# Patient Record
Sex: Female | Born: 1975 | Race: Black or African American | Hispanic: No | Smoking: Never smoker
Health system: Southern US, Community
[De-identification: ages and names within clinical notes are randomized; demographics above are authoritative.]

## PROBLEM LIST (undated history)

## (undated) ENCOUNTER — Inpatient Hospital Stay (HOSPITAL_COMMUNITY): Payer: Managed Care, Other (non HMO)

## (undated) DIAGNOSIS — G43909 Migraine, unspecified, not intractable, without status migrainosus: Secondary | ICD-10-CM

## (undated) DIAGNOSIS — R238 Other skin changes: Secondary | ICD-10-CM

## (undated) DIAGNOSIS — F419 Anxiety disorder, unspecified: Secondary | ICD-10-CM

## (undated) DIAGNOSIS — I10 Essential (primary) hypertension: Secondary | ICD-10-CM

## (undated) DIAGNOSIS — K219 Gastro-esophageal reflux disease without esophagitis: Secondary | ICD-10-CM

## (undated) DIAGNOSIS — R569 Unspecified convulsions: Secondary | ICD-10-CM

## (undated) DIAGNOSIS — D649 Anemia, unspecified: Secondary | ICD-10-CM

## (undated) DIAGNOSIS — J45909 Unspecified asthma, uncomplicated: Secondary | ICD-10-CM

## (undated) DIAGNOSIS — F32A Depression, unspecified: Secondary | ICD-10-CM

## (undated) DIAGNOSIS — F329 Major depressive disorder, single episode, unspecified: Secondary | ICD-10-CM

## (undated) DIAGNOSIS — E7800 Pure hypercholesterolemia, unspecified: Secondary | ICD-10-CM

## (undated) DIAGNOSIS — R011 Cardiac murmur, unspecified: Secondary | ICD-10-CM

## (undated) DIAGNOSIS — E669 Obesity, unspecified: Secondary | ICD-10-CM

## (undated) DIAGNOSIS — T7840XA Allergy, unspecified, initial encounter: Secondary | ICD-10-CM

## (undated) DIAGNOSIS — E781 Pure hyperglyceridemia: Secondary | ICD-10-CM

## (undated) DIAGNOSIS — G4730 Sleep apnea, unspecified: Secondary | ICD-10-CM

## (undated) DIAGNOSIS — E785 Hyperlipidemia, unspecified: Secondary | ICD-10-CM

## (undated) HISTORY — DX: Unspecified asthma, uncomplicated: J45.909

## (undated) HISTORY — PX: APPENDECTOMY: SHX54

## (undated) HISTORY — DX: Hyperlipidemia, unspecified: E78.5

## (undated) HISTORY — PX: TUBAL LIGATION: SHX77

## (undated) HISTORY — DX: Cardiac murmur, unspecified: R01.1

## (undated) HISTORY — DX: Migraine, unspecified, not intractable, without status migrainosus: G43.909

## (undated) HISTORY — DX: Allergy, unspecified, initial encounter: T78.40XA

## (undated) HISTORY — DX: Sleep apnea, unspecified: G47.30

## (undated) HISTORY — DX: Essential (primary) hypertension: I10

## (undated) HISTORY — PX: TONSILLECTOMY: SUR1361

## (undated) HISTORY — DX: Other skin changes: R23.8

## (undated) HISTORY — DX: Gastro-esophageal reflux disease without esophagitis: K21.9

## (undated) HISTORY — DX: Obesity, unspecified: E66.9

## (undated) HISTORY — DX: Anemia, unspecified: D64.9

---

## 2000-10-07 ENCOUNTER — Other Ambulatory Visit: Admission: RE | Admit: 2000-10-07 | Discharge: 2000-10-07 | Payer: Self-pay | Admitting: *Deleted

## 2003-02-11 ENCOUNTER — Emergency Department (HOSPITAL_COMMUNITY): Admission: EM | Admit: 2003-02-11 | Discharge: 2003-02-11 | Payer: Self-pay | Admitting: Emergency Medicine

## 2003-09-30 ENCOUNTER — Emergency Department (HOSPITAL_COMMUNITY): Admission: EM | Admit: 2003-09-30 | Discharge: 2003-09-30 | Payer: Self-pay | Admitting: Emergency Medicine

## 2003-11-01 ENCOUNTER — Ambulatory Visit (HOSPITAL_COMMUNITY): Admission: RE | Admit: 2003-11-01 | Discharge: 2003-11-01 | Payer: Self-pay | Admitting: Family Medicine

## 2006-11-01 ENCOUNTER — Emergency Department (HOSPITAL_COMMUNITY): Admission: EM | Admit: 2006-11-01 | Discharge: 2006-11-02 | Payer: Self-pay | Admitting: Emergency Medicine

## 2006-11-02 ENCOUNTER — Ambulatory Visit (HOSPITAL_COMMUNITY): Admission: RE | Admit: 2006-11-02 | Discharge: 2006-11-02 | Payer: Self-pay | Admitting: Obstetrics and Gynecology

## 2006-11-05 ENCOUNTER — Inpatient Hospital Stay (HOSPITAL_COMMUNITY): Admission: AD | Admit: 2006-11-05 | Discharge: 2006-11-05 | Payer: Self-pay | Admitting: Obstetrics and Gynecology

## 2007-08-12 ENCOUNTER — Inpatient Hospital Stay (HOSPITAL_COMMUNITY): Admission: AD | Admit: 2007-08-12 | Discharge: 2007-08-13 | Payer: Self-pay | Admitting: Obstetrics and Gynecology

## 2007-08-17 ENCOUNTER — Inpatient Hospital Stay (HOSPITAL_COMMUNITY): Admission: RE | Admit: 2007-08-17 | Discharge: 2007-08-20 | Payer: Self-pay | Admitting: Obstetrics and Gynecology

## 2007-08-17 ENCOUNTER — Encounter (INDEPENDENT_AMBULATORY_CARE_PROVIDER_SITE_OTHER): Payer: Self-pay | Admitting: Obstetrics and Gynecology

## 2010-03-28 ENCOUNTER — Encounter: Payer: Self-pay | Admitting: Cardiology

## 2010-04-05 ENCOUNTER — Encounter: Payer: Self-pay | Admitting: Cardiology

## 2010-04-05 ENCOUNTER — Ambulatory Visit (INDEPENDENT_AMBULATORY_CARE_PROVIDER_SITE_OTHER): Payer: Managed Care, Other (non HMO) | Admitting: Cardiology

## 2010-04-05 DIAGNOSIS — E669 Obesity, unspecified: Secondary | ICD-10-CM

## 2010-04-05 DIAGNOSIS — E663 Overweight: Secondary | ICD-10-CM | POA: Insufficient documentation

## 2010-04-05 DIAGNOSIS — E785 Hyperlipidemia, unspecified: Secondary | ICD-10-CM

## 2010-04-09 ENCOUNTER — Other Ambulatory Visit: Payer: Self-pay | Admitting: Cardiology

## 2010-04-09 ENCOUNTER — Encounter: Payer: Self-pay | Admitting: Cardiology

## 2010-04-09 ENCOUNTER — Other Ambulatory Visit (INDEPENDENT_AMBULATORY_CARE_PROVIDER_SITE_OTHER): Payer: Managed Care, Other (non HMO)

## 2010-04-09 DIAGNOSIS — E785 Hyperlipidemia, unspecified: Secondary | ICD-10-CM

## 2010-04-10 LAB — LIPID PANEL
Total CHOL/HDL Ratio: 5
VLDL: 33.2 mg/dL (ref 0.0–40.0)

## 2010-04-10 NOTE — Assessment & Plan Note (Signed)
Summary: np6. [facility] office asking for tw.  per karen office [phone]...   Visit Type:  Initial Consult Primary Provider:  Dr. Vincente Poli or Dr, Phillips Odor  CC:  anxiety. pt has no complaints today.  History of Present Illness: Samantha Monroe comes today refered for elevated trigycerides of 422. This was a nonfasting sample. TC was above 200.  She has no other CRFs except sedentary lifestyle. She denies any chest pain or exertional limitations. She struggles with her weight.  Current Medications (verified): 1)  Hydrocodone-Acetaminophen 5-325 Mg Tabs (Hydrocodone-Acetaminophen) .Marland Kitchen.. 1 To 2 Tablets Every 4-6 Hours As Needed For Headaches 2)  Lexapro 20 Mg Tabs (Escitalopram Oxalate) .... Take 1 Tablet By Mouth Once A Day 3)  Clonazepam 1 Mg Tabs (Clonazepam) .Marland Kitchen.. 1 Tab Two Times A Day As Needed For Anxiety 4)  Vitamin D (Ergocalciferol) 50000 Unit Caps (Ergocalciferol) .Marland Kitchen.. 1 Weekly 5)  Zolpidem Tartrate 10 Mg Tabs (Zolpidem Tartrate) .Marland Kitchen.. 1 Tab At Bedtime For Sleep 6)  Proair Hfa 108 (90 Base) Mcg/act Aers (Albuterol Sulfate) .... As Directed  Allergies (verified): 1)  ! Asa 2)  ! * Shellfish  Past History:  Past Surgical History: Last updated: 04/04/2010 TUBALIGATION  Social History: Last updated: 04/04/2010 Married  Tobacco Use - No.  Alcohol Use - no  Risk Factors: Smoking Status: never (04/04/2010)  Review of Systems       negative other than HPI  Vital Signs:  Patient profile:   35 year old female Height:      62 inches Weight:      148.50 pounds BMI:     27.26 Pulse rate:   96 / minute Resp:     18 per minute BP sitting:   126 / 100  (left arm) Cuff size:   large  Vitals Entered By: Celestia Khat, CMA (April 05, 2010 4:09 PM)  Physical Exam  General:  obese.   Head:  normocephalic and atraumatic Eyes:  PERRLA/EOM intact; conjunctiva and lids normal. Neck:  Neck supple, no JVD. No masses, thyromegaly or abnormal cervical nodes. Chest Samantha Monroe:  no deformities  or breast masses noted Lungs:  Clear bilaterally to auscultation and percussion. Heart:  PMI NORMAL, N1 S1 AND S2, NO MURMUR, NO BRUITS Abdomen:  BS NORMAL, NT, ND, NO BRUITS. Msk:  Back normal, normal gait. Muscle strength and tone normal. Pulses:  pulses normal in all 4 extremities Extremities:  No clubbing or cyanosis. Neurologic:  Alert and oriented x 3. Skin:  Intact without lesions or rashes. Psych:  Normal affect.   Problems:  Medical Problems Added: 1)  Dx of Overweight/obesity  (ICD-278.02) 2)  Dx of Dyslipidemia  (ICD-272.4)  Impression & Recommendations:  Problem # 1:  DYSLIPIDEMIA (ICD-272.4) Assessment New NEEDS FASTING LIPIDS ESP TRIGLYCERIDES. NEEDS 3 HRS EXERCISE A WEEK AND LOSE 15 LBS REALISTICALLY. Orders: EKG w/ Interpretation (93000)  Problem # 2:  OVERWEIGHT/OBESITY (ICD-278.02)  Patient Instructions: 1)  Your physician recommends that you schedule a follow-up appointment in: as needed with Dr. Daleen Squibb 2)  Your physician recommends that you continue on your current medications as directed. Please refer to the Current Medication list given to you today. 3)  Your physician recommends that you return for a FASTING lipid profile: Make sure you have been fasting for 12 hours before your lab test 4)  Follow a low carbohydrate diet. Please see diet information. 5)  Your physician discussed the importance of regular exercise and recommended that you start or continue a regular exercise  program for good health. Walk 30 minutes a day for a total of 3 hours / week

## 2010-04-30 NOTE — Letter (Signed)
[facility]   Imported By: Kassie Mends 04/24/2010 08:42:46  _____________________________________________________________________  External Attachment:    Type:   Image     Comment:   External Document

## 2010-05-29 ENCOUNTER — Emergency Department (HOSPITAL_COMMUNITY)
Admission: EM | Admit: 2010-05-29 | Discharge: 2010-05-30 | Disposition: A | Discharge status: Home / Self Care | Payer: Managed Care, Other (non HMO) | Attending: Emergency Medicine | Admitting: Emergency Medicine

## 2010-05-29 DIAGNOSIS — Z79899 Other long term (current) drug therapy: Secondary | ICD-10-CM | POA: Insufficient documentation

## 2010-05-29 DIAGNOSIS — F341 Dysthymic disorder: Secondary | ICD-10-CM | POA: Insufficient documentation

## 2010-05-29 DIAGNOSIS — J351 Hypertrophy of tonsils: Secondary | ICD-10-CM | POA: Insufficient documentation

## 2010-05-29 DIAGNOSIS — J029 Acute pharyngitis, unspecified: Secondary | ICD-10-CM | POA: Insufficient documentation

## 2010-05-29 LAB — RAPID STREP SCREEN (MED CTR MEBANE ONLY): Streptococcus, Group A Screen (Direct): NEGATIVE

## 2010-07-02 NOTE — Op Note (Signed)
NAME:  Samantha Monroe, Samantha Monroe               ACCOUNT NO.:  [id number]   MEDICAL RECORD NO.:  [id number]          PATIENT TYPE:  INP   LOCATION:  9118                          FACILITY:  [facility]   PHYSICIAN:  Guy Sandifer. Henderson Cloud, M.D. DATE OF BIRTH:  [birth year]   DATE OF PROCEDURE:  08/17/2007  DATE OF DISCHARGE:                               OPERATIVE REPORT   PREOPERATIVE DIAGNOSES:  1. Intrauterine pregnancy at 36-3/7 weeks estimated gestational age.  2. Marginal placental abruption.   POSTOPERATIVE DIAGNOSES:  1. Intrauterine pregnancy at 36-3/7 weeks estimated gestational age.  2. Marginal placental abruption.   PROCEDURE:  Repeat low transverse cesarean section and bilateral tubal  ligation.   SURGEON:  Guy Sandifer. Henderson Cloud, MD   ANESTHESIA:  Spinal, Raul Del, MD   SPECIMENS:  Placenta and bilateral fallopian tube segments to pathology.   FINDINGS:  Viable female infant, Apgars of 9 and 9 at 1 and 5 minutes  respectively.  Birth weight pending.  Arterial cord pH 7.36.   ESTIMATED BLOOD LOSS:  900 mL.   INDICATIONS AND CONSENT:  This patient is a 35 year old married black  female G5, P2 with an EDC of September 12, 2007.  Prenatal care has been  complicated by 2 previous cesarean sections.  She has a history of  anxiety and depression and was on an SSRI antidepressant, had  conception.  Currently, not on medication for that.  She has had  complaints of bleeding, on and off for several days.  She was treated  recently for urinary tract infection.  She passed a clot vaginally in  the last 2 days or so.  Denies trauma or abdominal pain.  Today, in the  office  ultrasound is consistent with a 4 cm clot at the placental  margin.  There is an anterior placenta and an estimated fetal weight of  7 pounds 4 ounces.  Uterus is soft and nontender.  Fetal heart tones  were reactive.  Recommendation for delivery is made secondary to  possibility of progressing placental abruption.  This is discussed  with  the patient.  Potential risks and complications were reviewed  preoperatively including but limited to infection, organ damage,  bleeding requiring transfusion of blood products with possible  transfusion reaction, HIV and hepatitis acquisition, DVT, PE, and  pneumonia.  Possible placenta accreta in view of the anterior placenta  in the 2 previous cesarean section was discussed.  Possible percreta and  possible cesarean hysterectomy and/or damage and repair to the bladder,  is also reviewed.  The patient is typed and crossed for 2 units  preoperatively.  In the office, the cervix was closed and thick, and  there is no active bleeding at present.   PROCEDURE:  The patient is taken to operating room where she is  identified, spinal anesthetic is placed, and she is placed in the dorsal  supine position with a 15 degrees left lateral wedge.  She is prepped,  Foley catheter was placed in the bladder to drain and she is draped in  the sterile fashion.  After testing for adequate spinal anesthesia,  skin  is entered through the previous Pfannenstiel scar.  Dissection is  carried out in layers to peritoneum.  Peritoneum is incised extended  superiorly and inferiorly.  Vesicouterine peritoneum is taken down  cephalad laterally.  Bladder flap was developed and the bladder blade  was placed.  Uterus is incised in a low transverse manner.  The uterine  cavity is entered bluntly with a hemostat.  The uterine incision is  extended cephalad laterally with fingers.  Artificial rupture of  membranes for clear fluid is carried out.  Vertex was then delivered  without difficulty.  Remainder of the baby is delivered.  There is a  true knot noted in the cord.  The oronasal pharynx were suctioned.  Good  cry and tone is noted and the cord is clamped and cut.  The baby is  handed to the waiting pediatrics team.  Placenta was manually delivered  and sent to pathology.  Uterine cavity is clean.  Uterus is  closed in  two running locking imbricating layers of 0 Monocryl suture which  achieves good hemostasis.  Tubes and ovaries are normal bilaterally.  After again confirming with the patient that she wants a sterilization,  the left fallopian tube was identified from cornu to fimbria.  It is  grasped in its mid ampullary portion with a Babcock clamp.  A knuckle of  tube is then doubly ligated with two free ties of zero plain suture.  The intervening knuckle was then sharply resected with scissors.  Cautery is used to obtain complete hemostasis.  Similar procedure is  carried out on the right side.  Irrigation is carried out.  Anterior  peritoneum was closed in running fashion with 0 Monocryl suture and the  anterior rectus fascia is closed with a 0 PDS suture.  Skin is closed  with clips.  All sponge, instruments and needle counts were correct and  the patient is transferred to recovery room in stable condition.      Guy Sandifer Henderson Cloud, M.D.  Electronically Signed     JET/MEDQ  D:  08/17/2007  T:  08/18/2007  Job:  [id number]

## 2010-07-05 NOTE — Discharge Summary (Signed)
NAME:  Samantha Monroe, Samantha Monroe               ACCOUNT NO.:  [id number]   MEDICAL RECORD NO.:  [id number]          PATIENT TYPE:  INP   LOCATION:  9118                          [facility]ILITY:  [facility]   PHYSICIAN:  Michelle L. Grewal, M.D.DATE OF BIRTH:  Jul 04, 1975   DATE OF ADMISSION:  08/17/2007  DATE OF DISCHARGE:  08/20/2007                               DISCHARGE SUMMARY   ADMITTING DIAGNOSES:  1. Intrauterine pregnancy at 36-3/7th weeks' estimated gestational      age.  2. Marginal placental abruption.   DISCHARGE DIAGNOSES:  1. Status post low-transverse cesarean section.  2. Viable female infant.  3. Permanent sterilization.   PROCEDURE:  1. Repeat low-transverse cesarean section.  2. Bilateral tubal ligation.   REASON FOR ADMISSION:  Please see written H&P.   HOSPITAL COURSE:  The patient is a 35 year old married black female  gravida 5, para 2 that presented to [facility] for a  cesarean delivery.  The patient had been complaining of some bleeding  off and on for several days.  She had recently been treated for urinary  tract infection.  The patient did pass a clot vaginally in the last 2  days.  She denied any trauma or abdominal pain.  The patient had been  seen in the office on the day of admission where an ultrasound had been  performed, which revealed a 4-cm clot noted at the placental margin.  Placenta was noted to be anterior and estimated fetal weight was 7  pounds 4 ounces.  Uterus was soft and nontender and fetal heart tones  were reactive.  Decision was made to proceed with a cesarean delivery  secondary to the possibility for progressing placental abruption.  Due  to multiparity, the patient requested permanent sterilization.  Cervix  was examined in the office and was found to be closed and thick and no  active bleeding was noted at the time of office visit.  The patient was  then taken to the operating room where spinal anesthesia was  administered without  difficulty.  A low-transverse incision was made  with delivery of a viable female infant weighing 6 pounds 10 ounces with  Apgars of 9 at and 9 at 5 minutes.  Arterial cord pH of 7.36.  Bilateral tubal ligation was performed without difficulty.  The patient  tolerated the procedure well and was taken to the recovery room in  stable condition.  On postoperative day 1, the patient was without  complaint.  She denied headache or blurred vision or right upper  quadrant pain.  Vital signs were stable.  Blood pressures 116/81 to  131/86.  Deep tendon reflexes were 1+ and no clonus.  Abdomen soft.  Fundus firm and nontender.  Abdominal dressing noted to be clean, dry,  and intact.  Due to the patient's slightly elevation in blood pressure  with a history of placental abruption, PIH labs were ordered for the  following morning.  On postoperative day 2, the patient did desire early  discharge.  Vital signs were stable.  Blood pressures were 116/76 to  108/73.  Abdomen soft.  Fundus firm and nontender.  Incision was clean,  dry, and intact.  Laboratory findings with hemoglobin of 11.4, platelet  count of 223,000, WBC count of 11.2, AST was 26, ALT was 14, and uric  acid was 4.5.  Later that day, the patient decided to stay an additional  day.  Discharge was cancelled.  On postoperative day 3, vital signs were  stable.  She was afebrile.  Fundus firm and nontender.  Incision was  clean, dry, and intact.  Staples removed and the patient was later  discharged home.   CONDITION ON DISCHARGE:  Stable.   DIET:  Regular as tolerated.   ACTIVITY:  No heavy lifting, no driving x2 weeks, and no vaginal entry.   FOLLOW UP:  The patient is to follow up in the office in 1-2 weeks for  incision check.  She is to call for temperature greater than 100  degrees, persistent nausea, vomiting, heavy vaginal bleeding, and/or  redness or drainage from the incisional site.   DISCHARGE MEDICATIONS:  1.  Tylox #30 one p.o. every 4-6 hours p.r.n.  2. Motrin 600 mg every 6 hours.  3. Prenatal vitamins 1 p.o. daily.      Julio Sicks, N.P.      Stann Mainland. Vincente Poli, M.D.  Electronically Signed    CC/MEDQ  D:  09/22/2007  T:  09/23/2007  Job:  [id number]

## 2010-11-14 LAB — CROSSMATCH: ABO/RH(D): A NEG

## 2010-11-14 LAB — CBC
HCT: 36.6
HCT: 36.7
Hemoglobin: 12.5
Hemoglobin: 12.4
MCHC: 34.1
MCV: 87.3
Platelets: 248
Platelets: 223
Platelets: 228
RBC: 4.24
RDW: 15.3
RDW: 15.2
WBC: 7.7
WBC: 11.2 — ABNORMAL HIGH

## 2010-11-14 LAB — URINALYSIS, ROUTINE W REFLEX MICROSCOPIC
Bilirubin Urine: NEGATIVE
Glucose, UA: NEGATIVE
Ketones, ur: NEGATIVE
Protein, ur: NEGATIVE
Urobilinogen, UA: 0.2
pH: 6.5

## 2010-11-14 LAB — URIC ACID: Uric Acid, Serum: 4.5

## 2010-11-14 LAB — RH IMMUNE GLOB WKUP(>/=20WKS)(NOT WOMEN'S HOSP)

## 2010-11-14 LAB — CCBB MATERNAL DONOR DRAW

## 2010-11-14 LAB — RPR: RPR Ser Ql: NONREACTIVE

## 2010-11-14 LAB — COMPREHENSIVE METABOLIC PANEL
AST: 26
Albumin: 2.3 — ABNORMAL LOW
Alkaline Phosphatase: 78
BUN: 2 — ABNORMAL LOW
Calcium: 8.8
GFR calc non Af Amer: >60
Potassium: 3.5
Sodium: 137

## 2010-11-14 LAB — URINE MICROSCOPIC-ADD ON

## 2010-11-14 LAB — DIFFERENTIAL
Eosinophils Relative: 2
Lymphocytes Relative: 15
Monocytes Relative: 8
Neutro Abs: 8.5 — ABNORMAL HIGH

## 2010-11-14 LAB — LACTATE DEHYDROGENASE: LDH: 155

## 2010-11-28 LAB — RH IMMUNE GLOBULIN WORKUP (NOT WOMEN'S HOSP)
ABO/RH(D): A NEG
Antibody Screen: NEGATIVE

## 2010-11-29 LAB — URINALYSIS, ROUTINE W REFLEX MICROSCOPIC
Bilirubin Urine: NEGATIVE
Glucose, UA: NEGATIVE
Ketones, ur: NEGATIVE
Ketones, ur: NEGATIVE
Leukocytes, UA: NEGATIVE
Nitrite: NEGATIVE
Protein, ur: NEGATIVE
Specific Gravity, Urine: 1.02
Urobilinogen, UA: 0.2
Urobilinogen, UA: 0.2

## 2010-11-29 LAB — GC/CHLAMYDIA PROBE AMP, GENITAL: Chlamydia, DNA Probe: NEGATIVE

## 2010-11-29 LAB — ABO/RH: ABO/RH(D): A NEG

## 2010-11-29 LAB — WET PREP, GENITAL

## 2010-11-29 LAB — RPR: RPR Ser Ql: NONREACTIVE

## 2010-11-29 LAB — HCG, QUANTITATIVE, PREGNANCY: hCG, Beta Chain, Quant, S: 5656 — ABNORMAL HIGH

## 2011-04-08 ENCOUNTER — Encounter (HOSPITAL_COMMUNITY): Payer: Self-pay

## 2011-04-08 ENCOUNTER — Emergency Department (HOSPITAL_COMMUNITY): Payer: Managed Care, Other (non HMO)

## 2011-04-08 ENCOUNTER — Other Ambulatory Visit: Payer: Self-pay

## 2011-04-08 ENCOUNTER — Emergency Department (HOSPITAL_COMMUNITY)
Admission: EM | Admit: 2011-04-08 | Discharge: 2011-04-08 | Disposition: A | Discharge status: Home / Self Care | Payer: Managed Care, Other (non HMO) | Attending: Emergency Medicine | Admitting: Emergency Medicine

## 2011-04-08 DIAGNOSIS — R42 Dizziness and giddiness: Secondary | ICD-10-CM | POA: Insufficient documentation

## 2011-04-08 DIAGNOSIS — Z79899 Other long term (current) drug therapy: Secondary | ICD-10-CM | POA: Insufficient documentation

## 2011-04-08 DIAGNOSIS — R079 Chest pain, unspecified: Secondary | ICD-10-CM | POA: Insufficient documentation

## 2011-04-08 LAB — CBC
HCT: 37.9 % (ref 36.0–46.0)
MCHC: 32.7 g/dL (ref 30.0–36.0)
MCV: 84.8 fL (ref 78.0–100.0)
Platelets: 285 10*3/uL (ref 150–400)
RDW: 14.4 % (ref 11.5–15.5)
WBC: 7.1 10*3/uL (ref 4.0–10.5)

## 2011-04-08 LAB — BASIC METABOLIC PANEL
BUN: 14 mg/dL (ref 6–23)
Chloride: 100 mEq/L (ref 96–112)
Creatinine, Ser: 0.66 mg/dL (ref 0.50–1.10)
GFR calc Af Amer: >90 mL/min (ref >90)
GFR calc non Af Amer: >90 mL/min (ref >90)
Potassium: 3.7 mEq/L (ref 3.5–5.1)

## 2011-04-08 LAB — TROPONIN I: Troponin I: <0.3 ng/mL (ref <0.30)

## 2011-04-08 NOTE — ED Provider Notes (Signed)
History     CSN: [id number]  Arrival date & time 04/08/11  1558   First MD Initiated Contact with Patient 04/08/11 2129      Chief Complaint  Patient presents with  . Chest Pain     HPI  History provided by the patient. Patient is a 36 year old African American female with history of borderline hypertension and hyperlipidemia currently treated with diet exercise and history of anxiety who presents with complaints of chest pain that began earlier this morning after arriving to work. Patient reports that symptoms began after she reached down below her desk to turn on her heater. She describes having pains in the middle and left side of her chest as if she had been "punched". Pain is a soreness that is intermittent and waxing and waning. Symptoms are worse if she bends over and reaches down or with deep breathing. She denies feeling short of breath, nausea or diaphoresis. Patient states she's had occasional symptoms of lightheadedness but no near syncope. Patient denies having similar symptoms previously. Those are described as mild and are relieved at this time. Symptoms lasted most of the day. 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 She has no recent travel or long sitting. She has no recent surgery. Patient does not take any form of birth control or estrogen. Patient has not had any history of cancers. Patient has no history of prior DVTs. She has no family history of coagulopathy.     History reviewed. No pertinent past medical history.  History reviewed. No pertinent past surgical history.  No family history on file.  History  Substance Use Topics  . Smoking status: Never Smoker   . Smokeless tobacco: Not on file  . Alcohol Use: Yes     occasional    OB History    Grav Para Term Preterm Abortions TAB SAB Ect Mult Living                  Review of Systems  Constitutional: Negative for fever, chills, diaphoresis and appetite change.  Respiratory:  Negative for cough and shortness of breath.   Cardiovascular: Positive for chest pain. Negative for palpitations and leg swelling.  Gastrointestinal: Negative for nausea, vomiting, abdominal pain, diarrhea and constipation.  Neurological: Positive for light-headedness. Negative for dizziness and headaches.  All other systems reviewed and are negative.    Allergies  Aspirin and Shellfish allergy  Home Medications   Current Outpatient Rx  Name Route Sig Dispense Refill  . CLONAZEPAM 1 MG PO TABS Oral Take 1 mg by mouth 2 (two) times daily.    Marland Kitchen ESCITALOPRAM OXALATE 20 MG PO TABS Oral Take 20 mg by mouth daily.    Marland Kitchen HYDROCODONE-ACETAMINOPHEN 5-325 MG PO TABS Oral Take 1 tablet by mouth every 6 (six) hours as needed. For migraines    . ZOLPIDEM TARTRATE 10 MG PO TABS Oral Take 10 mg by mouth at bedtime as needed. For insomina      BP 137/88  Pulse 69  Temp(Src) 97.1 F (36.2 C) (Oral)  Resp 16  Ht 5\' 2"  (1.575 m)  Wt 144 lb (65.318 kg)  BMI 26.34 kg/m2  SpO2 96%  LMP 03/25/2011  Physical Exam  Nursing note and vitals reviewed. Constitutional: She is oriented to person, place, and time. She appears well-developed and well-nourished. No distress.  HENT:  Head: Normocephalic and atraumatic.  Neck: Normal range of motion. Neck supple.  Cardiovascular: Normal rate and regular rhythm.  No murmur heard. Pulmonary/Chest: Effort normal and breath sounds normal. No respiratory distress. She has no wheezes. She has no rales. She exhibits no tenderness.  Abdominal: Soft. She exhibits no distension. There is no tenderness. There is no rebound and no guarding.  Musculoskeletal: She exhibits no edema and no tenderness.  Neurological: She is alert and oriented to person, place, and time.  Skin: Skin is warm and dry. No rash noted.  Psychiatric: She has a normal mood and affect. Her behavior is normal.    ED Course  Procedures   Results for orders placed during the hospital encounter  of 04/08/11  CBC      Component Value Range   WBC 7.1  4.0 - 10.5 (K/uL)   RBC 4.47  3.87 - 5.11 (MIL/uL)   Hemoglobin 12.4  12.0 - 15.0 (g/dL)   HCT 95.2  84.1 - 32.4 (%)   MCV 84.8  78.0 - 100.0 (fL)   MCH 27.7  26.0 - 34.0 (pg)   MCHC 32.7  30.0 - 36.0 (g/dL)   RDW 40.1  02.7 - 25.3 (%)   Platelets 285  150 - 400 (K/uL)  BASIC METABOLIC PANEL      Component Value Range   Sodium 135  135 - 145 (mEq/L)   Potassium 3.7  3.5 - 5.1 (mEq/L)   Chloride 100  96 - 112 (mEq/L)   CO2 25  19 - 32 (mEq/L)   Glucose, Bld 85  70 - 99 (mg/dL)   BUN 14  6 - 23 (mg/dL)   Creatinine, Ser 6.64  0.50 - 1.10 (mg/dL)   Calcium 40.3  8.4 - 10.5 (mg/dL)   GFR calc non Af Amer >90  >90 (mL/min)   GFR calc Af Amer >90  >90 (mL/min)  TROPONIN I      Component Value Range   Troponin I <0.30  <0.30 (ng/mL)  POCT I-STAT TROPONIN I      Component Value Range   Troponin i, poc 0.00  0.00 - 0.08 (ng/mL)   Comment 3                Dg Chest 2 View  04/08/2011  *RADIOLOGY REPORT*  Clinical Data: 36 year old female with chest pain.  CHEST - 2 VIEW  Comparison: None.  Findings: Normal lung volumes. Normal cardiac size and mediastinal contours.  Visualized tracheal air column is within normal limits. The lungs are clear.  No pneumothorax or effusion.  Negative visualized bowel gas pattern. No acute osseous abnormality identified.  IMPRESSION: Negative, no acute cardiopulmonary abnormality.  Original Report Authenticated By: Harley Hallmark, M.D.     1. Chest pain       MDM  10:05 PM patient seen and evaluated. Patient in no acute distress. She denies symptoms at this time. Pt is PERC negative.  Patient sees to be well. Her symptoms are atypical and seem worse with bending over and movements. Patient currently pain-free. Patient has no significant risk factors for ACS. ECG, chest x-ray and lab tests are normal today. At this time feel patient is able to return home and followup with PCP.    Date:  04/08/2011  Rate: 67  Rhythm: normal sinus rhythm  QRS Axis: normal  Intervals: normal  ST/T Wave abnormalities: normal  Conduction Disutrbances:none  Narrative Interpretation:   Old EKG Reviewed: none available          Angus Seller, PA 04/09/11 0502

## 2011-04-08 NOTE — Discharge Instructions (Signed)
You were seen and evaluated stay for your symptoms of chest pain. At this time your blood tests, EKG of your heart and x-ray of your chest have not shown any signs for concerning or emergent cause of your symptoms. This time your providers feel you're safe to return home and follow up your primary doctor and specialist tomorrow. Please call them to schedule a followup appointment. If you go up any worsening symptoms, develop shortness of breath, lightheadedness, nausea, sweating please return to emergency room immediately.  Chest Pain (Nonspecific) It is often hard to give a specific diagnosis for the cause of chest pain. There is always a chance that your pain could be related to something serious, such as a heart attack or a blood clot in the lungs. You need to follow up with your caregiver for further evaluation. CAUSES   Heartburn.   Pneumonia or bronchitis.   Anxiety and stress.   Inflammation around your heart (pericarditis) or lung (pleuritis or pleurisy).   A blood clot in the lung.   A collapsed lung (pneumothorax). It can develop suddenly on its own (spontaneous pneumothorax) or from injury (trauma) to the chest.  The chest wall is composed of bones, muscles, and cartilage. Any of these can be the source of the pain.  The bones can be bruised by injury.   The muscles or cartilage can be strained by coughing or overwork.   The cartilage can be affected by inflammation and become sore (costochondritis).  DIAGNOSIS  Lab tests or other studies, such as X-rays, an EKG, stress testing, or cardiac imaging, may be needed to find the cause of your pain.  TREATMENT   Treatment depends on what may be causing your chest pain. Treatment may include:   Acid blockers for heartburn.   Anti-inflammatory medicine.   Pain medicine for inflammatory conditions.   Antibiotics if an infection is present.   You may be advised to change lifestyle habits. This includes stopping smoking and  avoiding caffeine and chocolate.   You may be advised to keep your head raised (elevated) when sleeping. This reduces the chance of acid going backward from your stomach into your esophagus.   Most of the time, nonspecific chest pain will improve within 2 to 3 days with rest and mild pain medicine.  HOME CARE INSTRUCTIONS   If antibiotics were prescribed, take the full amount even if you start to feel better.   For the next few days, avoid physical activities that bring on chest pain. Continue physical activities as directed.   Do not smoke cigarettes or drink alcohol until your symptoms are gone.   Only take over-the-counter or prescription medicine for pain, discomfort, or fever as directed by your caregiver.   Follow your caregiver's suggestions for further testing if your chest pain does not go away.   Keep any follow-up appointments you made. If you do not go to an appointment, you could develop lasting (chronic) problems with pain. If there is any problem keeping an appointment, you must call to reschedule.  SEEK MEDICAL CARE IF:   You think you are having problems from the medicine you are taking. Read your medicine instructions carefully.   Your chest pain does not go away, even after treatment.   You develop a rash with blisters on your chest.  SEEK IMMEDIATE MEDICAL CARE IF:   You have increased chest pain or pain that spreads to your arm, neck, jaw, back, or belly (abdomen).   You develop shortness of  breath, an increasing cough, or you are coughing up blood.   You have severe back or abdominal pain, feel sick to your stomach (nauseous) or throw up (vomit).   You develop severe weakness, fainting, or chills.   You have an oral temperature above 102 F (38.9 C), not controlled by medicine.  THIS IS AN EMERGENCY. Do not wait to see if the pain will go away. Get medical help at once. Call your local emergency services (911 in [place] Do not drive yourself to the  hospital. MAKE SURE YOU:   Understand these instructions.   Will watch your condition.   Will get help right away if you are not doing well or get worse.  Document Released: 11/13/2004 Document Revised: 10/16/2010 Document Reviewed: 09/09/2007 [facility] Patient Information 2012 [place], [place].

## 2011-04-08 NOTE — ED Notes (Signed)
Pt given ice chips per MD

## 2011-04-08 NOTE — ED Notes (Signed)
Lt. Side chest pain began this morning. Pt.noticed when she bent over and with deep breaths.  She also feels dizzy,  She denies any n/v or sob.

## 2011-04-08 NOTE — ED Notes (Signed)
Lab at bedside for blood draw.

## 2011-04-09 NOTE — ED Provider Notes (Signed)
Medical screening examination/treatment/procedure(s) were performed by non-physician practitioner and as supervising physician I was immediately available for consultation/collaboration.  Flint Melter, MD 04/09/11 1415

## 2011-12-27 ENCOUNTER — Encounter (HOSPITAL_COMMUNITY): Payer: Self-pay | Admitting: Pharmacist

## 2011-12-29 ENCOUNTER — Emergency Department (HOSPITAL_COMMUNITY)
Admission: EM | Admit: 2011-12-29 | Discharge: 2011-12-30 | Disposition: A | Discharge status: Other Acute Inpatient Hospital | Payer: Managed Care, Other (non HMO) | Attending: Emergency Medicine | Admitting: Emergency Medicine

## 2011-12-29 ENCOUNTER — Encounter (HOSPITAL_COMMUNITY): Payer: Self-pay | Admitting: *Deleted

## 2011-12-29 DIAGNOSIS — T50901A Poisoning by unspecified drugs, medicaments and biological substances, accidental (unintentional), initial encounter: Secondary | ICD-10-CM

## 2011-12-29 DIAGNOSIS — T424X1A Poisoning by benzodiazepines, accidental (unintentional), initial encounter: Secondary | ICD-10-CM | POA: Insufficient documentation

## 2011-12-29 DIAGNOSIS — Y929 Unspecified place or not applicable: Secondary | ICD-10-CM | POA: Insufficient documentation

## 2011-12-29 DIAGNOSIS — T43502A Poisoning by unspecified antipsychotics and neuroleptics, intentional self-harm, initial encounter: Secondary | ICD-10-CM | POA: Insufficient documentation

## 2011-12-29 DIAGNOSIS — Z79899 Other long term (current) drug therapy: Secondary | ICD-10-CM | POA: Insufficient documentation

## 2011-12-29 DIAGNOSIS — R000 Tachycardia, unspecified: Secondary | ICD-10-CM | POA: Insufficient documentation

## 2011-12-29 DIAGNOSIS — F3289 Other specified depressive episodes: Secondary | ICD-10-CM | POA: Insufficient documentation

## 2011-12-29 DIAGNOSIS — R45851 Suicidal ideations: Secondary | ICD-10-CM

## 2011-12-29 DIAGNOSIS — F329 Major depressive disorder, single episode, unspecified: Secondary | ICD-10-CM | POA: Insufficient documentation

## 2011-12-29 DIAGNOSIS — T438X2A Poisoning by other psychotropic drugs, intentional self-harm, initial encounter: Secondary | ICD-10-CM | POA: Insufficient documentation

## 2011-12-29 DIAGNOSIS — Y939 Activity, unspecified: Secondary | ICD-10-CM | POA: Insufficient documentation

## 2011-12-29 DIAGNOSIS — T424X4A Poisoning by benzodiazepines, undetermined, initial encounter: Secondary | ICD-10-CM | POA: Insufficient documentation

## 2011-12-29 DIAGNOSIS — E7800 Pure hypercholesterolemia, unspecified: Secondary | ICD-10-CM | POA: Insufficient documentation

## 2011-12-29 HISTORY — DX: Depression, unspecified: F32.A

## 2011-12-29 HISTORY — DX: Major depressive disorder, single episode, unspecified: F32.9

## 2011-12-29 HISTORY — DX: Pure hyperglyceridemia: E78.1

## 2011-12-29 HISTORY — DX: Pure hypercholesterolemia, unspecified: E78.00

## 2011-12-29 HISTORY — DX: Anxiety disorder, unspecified: F41.9

## 2011-12-29 LAB — CBC WITH DIFFERENTIAL/PLATELET
Basophils Absolute: 0 10*3/uL (ref 0.0–0.1)
Basophils Relative: 0 % (ref 0–1)
Eosinophils Absolute: 0.2 10*3/uL (ref 0.0–0.7)
Eosinophils Relative: 4 % (ref 0–5)
HCT: 37.6 % (ref 36.0–46.0)
MCHC: 33.5 g/dL (ref 30.0–36.0)
MCV: 86 fL (ref 78.0–100.0)
Monocytes Absolute: 0.2 10*3/uL (ref 0.1–1.0)
Platelets: 294 10*3/uL (ref 150–400)
RDW: 14.3 % (ref 11.5–15.5)

## 2011-12-29 LAB — COMPREHENSIVE METABOLIC PANEL
AST: 14 U/L (ref 0–37)
Albumin: 4 g/dL (ref 3.5–5.2)
Calcium: 9.6 mg/dL (ref 8.4–10.5)
Creatinine, Ser: 0.5 mg/dL (ref 0.50–1.10)
GFR calc non Af Amer: >90 mL/min (ref >90)
Sodium: 138 mEq/L (ref 135–145)
Total Protein: 7.7 g/dL (ref 6.0–8.3)

## 2011-12-29 LAB — URINALYSIS, ROUTINE W REFLEX MICROSCOPIC
Glucose, UA: NEGATIVE mg/dL
Ketones, ur: NEGATIVE mg/dL
Leukocytes, UA: NEGATIVE
pH: 5.5 (ref 5.0–8.0)

## 2011-12-29 LAB — ACETAMINOPHEN LEVEL
Acetaminophen (Tylenol), Serum: <15 ug/mL (ref 10–30)
Acetaminophen (Tylenol), Serum: <15 ug/mL (ref 10–30)

## 2011-12-29 LAB — SALICYLATE LEVEL: Salicylate Lvl: <2 mg/dL — ABNORMAL LOW (ref 2.8–20.0)

## 2011-12-29 LAB — RAPID URINE DRUG SCREEN, HOSP PERFORMED
Benzodiazepines: NOT DETECTED
Cocaine: NOT DETECTED
Opiates: NOT DETECTED

## 2011-12-29 LAB — URINE MICROSCOPIC-ADD ON

## 2011-12-29 MED ORDER — ESCITALOPRAM OXALATE 20 MG PO TABS
20.0000 mg | ORAL_TABLET | Freq: Every day | ORAL | Status: DC
Start: 2011-12-30 — End: 2011-12-30
  Filled 2011-12-29 (×2): qty 1

## 2011-12-29 MED ORDER — ONDANSETRON HCL 4 MG PO TABS: 4.0000 mg | ORAL_TABLET | Freq: Three times a day (TID) | ORAL | Status: DC | PRN

## 2011-12-29 MED ORDER — SODIUM CHLORIDE 0.9 % IV BOLUS (SEPSIS): 1000.0000 mL | Freq: Once | INTRAVENOUS | Status: DC

## 2011-12-29 MED ORDER — SODIUM CHLORIDE 0.9 % IV BOLUS (SEPSIS)
1000.0000 mL | Freq: Once | INTRAVENOUS | Status: AC
  Administered 2011-12-29 (×2): 1000 mL via INTRAVENOUS

## 2011-12-29 MED ORDER — IBUPROFEN 400 MG PO TABS: 600.0000 mg | ORAL_TABLET | Freq: Three times a day (TID) | ORAL | Status: DC | PRN

## 2011-12-29 MED ORDER — DIPHENHYDRAMINE HCL 25 MG PO CAPS
ORAL_CAPSULE | ORAL | Status: AC
  Administered 2011-12-29: 25 mg
  Filled 2011-12-29: qty 1

## 2011-12-29 MED ORDER — LORAZEPAM 1 MG PO TABS: 1.0000 mg | ORAL_TABLET | Freq: Three times a day (TID) | ORAL | Status: DC | PRN

## 2011-12-29 MED ORDER — CLONAZEPAM 0.5 MG PO TABS
1.0000 mg | ORAL_TABLET | Freq: Two times a day (BID) | ORAL | Status: DC
  Administered 2011-12-29: 1 mg via ORAL
  Filled 2011-12-29: qty 2

## 2011-12-29 NOTE — ED Notes (Signed)
One cell phone removed from pt's belongings given to pt's daughter per pt's request.

## 2011-12-29 NOTE — ED Notes (Signed)
Pt has three prescription bottle with her 1. Clonazepam  1mg  180 tablets filled on 09/08/2011, 5 tablets remaining  2. escitalopram 20mg  90 tablets filled on 10/17/2011 16 tablets remaining 3. ambien 10 mg 90 tablets filled on 09/29/203 with 13 tablets remaining

## 2011-12-29 NOTE — ED Notes (Signed)
Cell phone returned to nursing desk by family, states that pt;s daughter only needed to text pt's boyfriend to let him know that pt was here, cell phone placed back in pt's belongings.

## 2011-12-29 NOTE — ED Notes (Addendum)
Pt brought to er by [facility] with c/o overdose, pt states that she has been depressed lately due to separation from her husband, when asked pt admits to SI, states "I was trying to hurt myself", denies any HI. Pt admits to taking 1-2 hydrocodone, 1 percocet, 10 Ambien, drinking 40oz beer around 2pm. Pt was given 50 grams charcoal by ems prior to arrival in er. Pt drowsy, will arouse with verbal stimuli, speech slurred at times. All belongings removed from room, bagged, placed in locker, pt wanded by security

## 2011-12-29 NOTE — ED Notes (Signed)
Called lab ref. Status of urine drug screen, advised it would be 10 more minutes.

## 2011-12-29 NOTE — ED Provider Notes (Signed)
History   This chart was scribed for Glynn Octave, MD, by Marcina Millard scribe. The patient was seen in room APA16A/APA16A and the patient's care was started at 1517.    CSN: [id number]  Arrival date & time 12/29/11  1509   First MD Initiated Contact with Patient 12/29/11 1517      Chief Complaint  Patient presents with  . V70.1    (Consider location/radiation/quality/duration/timing/severity/associated sxs/prior treatment) HPI Comments: Samantha Monroe is a 36 y.o. female brought in by EMS who presents to the Emergency Department complaining of overdose that began after she took 10 ambien, 1 percocet, and 1 hydrocodone at 2 pm. She reports that she also consumed a 40 oz beer. She denies any back pain, fever, vomiting, or use of illegal drugs. She reports that she has a h/o of depression and regularly takes [place], klonopin, and lexapro. She denies any other existing medical conditions andis not currently taking any kind of birth control.      Past Medical History  Diagnosis Date  . Depression   . High cholesterol   . High triglycerides     Past Surgical History  Procedure Date  . Tubal ligation   . Cesarean section   . Appendectomy     No family history on file.  History  Substance Use Topics  . Smoking status: Never Smoker   . Smokeless tobacco: Not on file  . Alcohol Use: Yes     Comment: occasional    OB History    Grav Para Term Preterm Abortions TAB SAB Ect Mult Living                  Review of Systems A complete 10 system review of systems was obtained and all systems are negative except as noted in the HPI and PMH.   Allergies  Aspirin and Shellfish allergy  Home Medications   Current Outpatient Rx  Name  Route  Sig  Dispense  Refill  . CLONAZEPAM 1 MG PO TABS   Oral   Take 1 mg by mouth 2 (two) times daily.         Marland Kitchen ESCITALOPRAM OXALATE 20 MG PO TABS   Oral   Take 20 mg by mouth daily.         Marland Kitchen HYDROCODONE-ACETAMINOPHEN 5-325  MG PO TABS   Oral   Take 1 tablet by mouth every 6 (six) hours as needed. For migraines         . ZOLPIDEM TARTRATE 10 MG PO TABS   Oral   Take 10 mg by mouth at bedtime as needed. For insomina           BP 153/94  Pulse 129  Temp 99 F (37.2 C)  Resp 16  Ht 5\' 2"  (1.575 m)  Wt 140 lb (63.504 kg)  BMI 25.61 kg/m2  SpO2 96%  LMP 12/22/2011  Physical Exam  Nursing note and vitals reviewed. Constitutional: She is oriented to person, place, and time. She appears well-developed and well-nourished. No distress.       She appears sleepy, but is alert when aroused.     HENT:  Head: Normocephalic and atraumatic.  Eyes: EOM are normal. Pupils are equal, round, and reactive to light.  Neck: Normal range of motion. Neck supple. No tracheal deviation present.  Cardiovascular: Tachycardia present.        tacchy sleppy  Moving all extremities no clonus.  Pulmonary/Chest: Effort normal. No respiratory distress.  Abdominal: Soft. She  exhibits no distension. There is tenderness. There is no rebound and no guarding.  Musculoskeletal: Normal range of motion. She exhibits no edema.       She is moving all extremities and has no clonus.  Neurological: She is alert and oriented to person, place, and time. No cranial nerve deficit. She exhibits normal muscle tone. Coordination normal.  Skin: Skin is warm and dry.    ED Course  Procedures (including critical care time)  DIAGNOSTIC STUDIES: Oxygen Saturation is 96% on room air, normal by my interpretation.    COORDINATION OF CARE:  15:40- Discussed planned course of treatment with the patient, including an IV and blood work, who is agreeable at this time.  Results for orders placed during the hospital encounter of 12/29/11  CBC WITH DIFFERENTIAL      Component Value Range   WBC 5.5  4.0 - 10.5 K/uL   RBC 4.37  3.87 - 5.11 MIL/uL   Hemoglobin 12.6  12.0 - 15.0 g/dL   HCT 40.9  81.1 - 91.4 %   MCV 86.0  78.0 - 100.0 fL   MCH 28.8   26.0 - 34.0 pg   MCHC 33.5  30.0 - 36.0 g/dL   RDW 78.2  95.6 - 21.3 %   Platelets 294  150 - 400 K/uL   Neutrophils Relative 50  43 - 77 %   Neutro Abs 2.7  1.7 - 7.7 K/uL   Lymphocytes Relative 42  12 - 46 %   Lymphs Abs 2.3  0.7 - 4.0 K/uL   Monocytes Relative 4  3 - 12 %   Monocytes Absolute 0.2  0.1 - 1.0 K/uL   Eosinophils Relative 4  0 - 5 %   Eosinophils Absolute 0.2  0.0 - 0.7 K/uL   Basophils Relative 0  0 - 1 %   Basophils Absolute 0.0  0.0 - 0.1 K/uL  COMPREHENSIVE METABOLIC PANEL      Component Value Range   Sodium 138  135 - 145 mEq/L   Potassium 3.1 (*) 3.5 - 5.1 mEq/L   Chloride 102  96 - 112 mEq/L   CO2 21  19 - 32 mEq/L   Glucose, Bld 218 (*) 70 - 99 mg/dL   BUN 10  6 - 23 mg/dL   Creatinine, Ser 0.86  0.50 - 1.10 mg/dL   Calcium 9.6  8.4 - 57.8 mg/dL   Total Protein 7.7  6.0 - 8.3 g/dL   Albumin 4.0  3.5 - 5.2 g/dL   AST 14  0 - 37 U/L   ALT 8  0 - 35 U/L   Alkaline Phosphatase 71  39 - 117 U/L   Total Bilirubin 0.3  0.3 - 1.2 mg/dL   GFR calc non Af Amer >90  >90 mL/min   GFR calc Af Amer >90  >90 mL/min  ETHANOL      Component Value Range   Alcohol, Ethyl (B) 116 (*) 0 - 11 mg/dL  ACETAMINOPHEN LEVEL      Component Value Range   Acetaminophen (Tylenol), Serum <15.0  10 - 30 ug/mL  SALICYLATE LEVEL      Component Value Range   Salicylate Lvl <2.0 (*) 2.8 - 20.0 mg/dL  URINALYSIS, ROUTINE W REFLEX MICROSCOPIC      Component Value Range   Color, Urine YELLOW  YELLOW   APPearance CLEAR  CLEAR   Specific Gravity, Urine <1.005 (*) 1.005 - 1.030   pH 5.5  5.0 - 8.0  Glucose, UA NEGATIVE  NEGATIVE mg/dL   Hgb urine dipstick TRACE (*) NEGATIVE   Bilirubin Urine NEGATIVE  NEGATIVE   Ketones, ur NEGATIVE  NEGATIVE mg/dL   Protein, ur NEGATIVE  NEGATIVE mg/dL   Urobilinogen, UA 0.2  0.0 - 1.0 mg/dL   Nitrite NEGATIVE  NEGATIVE   Leukocytes, UA NEGATIVE  NEGATIVE  PREGNANCY, URINE      Component Value Range   Preg Test, Ur NEGATIVE  NEGATIVE    PROTIME-INR      Component Value Range   Prothrombin Time 13.4  11.6 - 15.2 seconds   INR 1.03  0.00 - 1.49  URINE MICROSCOPIC-ADD ON      Component Value Range   Squamous Epithelial / LPF RARE  RARE   WBC, UA 0-2  <3 WBC/hpf   RBC / HPF 0-2  <3 RBC/hpf   Bacteria, UA RARE  RARE      Labs Reviewed  CBC WITH DIFFERENTIAL  COMPREHENSIVE METABOLIC PANEL  ETHANOL  ACETAMINOPHEN LEVEL  SALICYLATE LEVEL  URINE RAPID DRUG SCREEN (HOSP PERFORMED)  URINALYSIS, ROUTINE W REFLEX MICROSCOPIC  PREGNANCY, URINE  PROTIME-INR   No results found.   No diagnosis found.    MDM  Intentional overdose on Ambien, hydrocodone and Percocet 2 hours ago. Receive charcoal via EMS. Admits to suicidal attempt. Tachycardic, abdomen soft and nontender.  Patient is awake and alert with mild tachycardia. We'll pursue tox workup. Patient given IV hydration for tachycardia. Tox labs are unremarkable. Her heart rate has improved to the 80s. A four-hour Tylenol level is undetectable. She is medically clear for psychiatric evaluation.   Date: 12/29/2011  Rate: 113  Rhythm: sinus tachycardia  QRS Axis: normal  Intervals: normal  ST/T Wave abnormalities: nonspecific ST/T changes  Conduction Disutrbances:none  Narrative Interpretation: rate faster  Old EKG Reviewed: changes noted  I personally performed the services described in this documentation, which was scribed in my presence.  The recorded information has been reviewed and considered.        Glynn Octave, MD 12/29/11 2046

## 2011-12-29 NOTE — ED Notes (Signed)
Poison Control contacted, spoke with Judeth Cornfield, advised to continue to monitor pt and continue with current treatment plan.

## 2011-12-29 NOTE — ED Notes (Signed)
Pt requesting something for headache, Dr. Manus Gunning notified, motrin offered, pt states that she is allergic and can only take tylenol, explained to pt that tylenol was in the medication that pt overdosed on and we would not be able to give her additional tylenol not knowing how much was in her system already. Pt expressed understanding.

## 2011-12-29 NOTE — ED Notes (Signed)
Sitter requested from ac for pt safety. wanded by security. Belongings bagged and secured.

## 2011-12-29 NOTE — ED Notes (Signed)
See triage note.
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
## 2011-12-30 NOTE — ED Notes (Signed)
[facility] is unable to transport patient

## 2011-12-30 NOTE — ED Notes (Signed)
Patient is now involuntary

## 2011-12-30 NOTE — ED Notes (Signed)
Patient is going to be transported via [employer]

## 2011-12-30 NOTE — ED Notes (Signed)
Patient is wanting to go home. EDP spoke with patient, told her that she will not be able to go tonight. She is resting and trying to go to sleep.

## 2012-01-08 NOTE — H&P (Signed)
36 year old female with history of BTL here for treatment of menorrhagia. Ultrasound in office showed normal endometrial  History of C Sections and scar measured 3.9 mm  Medical history  Recent attempted overdose - cleared by Psychiatry  Surgical history  C Section x 3  Meds see list  Allergic to ASA,  Motrin and shellfish  Afebrile Vital signs stable General alert and oriented Lung CTAB Car RRR Abdomen is soft and non tender  Pelvic is normal  IMPRESSION: Menorrhagia  PLAN: D and C  Hysteroscopy Thermachoice Endometrial ablation

## 2012-01-09 ENCOUNTER — Encounter (HOSPITAL_COMMUNITY): Payer: Self-pay | Admitting: Anesthesiology

## 2012-01-09 ENCOUNTER — Encounter (HOSPITAL_COMMUNITY)
Admission: RE | Disposition: A | Discharge status: Home / Self Care | Payer: Self-pay | Source: Ambulatory Visit | Attending: Obstetrics and Gynecology

## 2012-01-09 ENCOUNTER — Ambulatory Visit (HOSPITAL_COMMUNITY): Payer: Managed Care, Other (non HMO) | Admitting: Anesthesiology

## 2012-01-09 ENCOUNTER — Ambulatory Visit (HOSPITAL_COMMUNITY)
Admission: RE | Admit: 2012-01-09 | Discharge: 2012-01-09 | Disposition: A | Discharge status: Home / Self Care | Payer: Managed Care, Other (non HMO) | Source: Ambulatory Visit | Attending: Obstetrics and Gynecology | Admitting: Obstetrics and Gynecology

## 2012-01-09 DIAGNOSIS — E663 Overweight: Secondary | ICD-10-CM

## 2012-01-09 DIAGNOSIS — N920 Excessive and frequent menstruation with regular cycle: Secondary | ICD-10-CM | POA: Insufficient documentation

## 2012-01-09 DIAGNOSIS — E785 Hyperlipidemia, unspecified: Secondary | ICD-10-CM

## 2012-01-09 HISTORY — PX: DILITATION & CURRETTAGE/HYSTROSCOPY WITH THERMACHOICE ABLATION: SHX5569

## 2012-01-09 LAB — CBC
MCHC: 32.5 g/dL (ref 30.0–36.0)
MCV: 86.2 fL (ref 78.0–100.0)
Platelets: 303 10*3/uL (ref 150–400)
RDW: 14.7 % (ref 11.5–15.5)
WBC: 7.1 10*3/uL (ref 4.0–10.5)

## 2012-01-09 SURGERY — DILATATION & CURETTAGE/HYSTEROSCOPY WITH THERMACHOICE ABLATION
Anesthesia: General | Site: Uterus | Wound class: Clean Contaminated

## 2012-01-09 MED ORDER — FENTANYL CITRATE 0.05 MG/ML IJ SOLN
INTRAMUSCULAR | Status: DC | PRN
  Administered 2012-01-09 (×2): 50 ug via INTRAVENOUS

## 2012-01-09 MED ORDER — ACETAMINOPHEN 10 MG/ML IV SOLN
INTRAVENOUS | Status: AC
  Filled 2012-01-09: qty 100

## 2012-01-09 MED ORDER — FENTANYL CITRATE 0.05 MG/ML IJ SOLN
INTRAMUSCULAR | Status: AC
  Administered 2012-01-09: 50 ug via INTRAVENOUS
  Filled 2012-01-09: qty 2

## 2012-01-09 MED ORDER — FENTANYL CITRATE 0.05 MG/ML IJ SOLN
INTRAMUSCULAR | Status: AC
  Filled 2012-01-09: qty 2

## 2012-01-09 MED ORDER — MIDAZOLAM HCL 5 MG/5ML IJ SOLN
INTRAMUSCULAR | Status: DC | PRN
  Administered 2012-01-09: 2 mg via INTRAVENOUS

## 2012-01-09 MED ORDER — PROPOFOL 10 MG/ML IV BOLUS
INTRAVENOUS | Status: DC | PRN
  Administered 2012-01-09: 200 mg via INTRAVENOUS

## 2012-01-09 MED ORDER — PROPOFOL 10 MG/ML IV EMUL
INTRAVENOUS | Status: AC
  Filled 2012-01-09: qty 20

## 2012-01-09 MED ORDER — LIDOCAINE HCL (CARDIAC) 20 MG/ML IV SOLN
INTRAVENOUS | Status: AC
  Filled 2012-01-09: qty 5

## 2012-01-09 MED ORDER — CEFAZOLIN SODIUM-DEXTROSE 2-3 GM-% IV SOLR
2.0000 g | INTRAVENOUS | Status: AC
  Administered 2012-01-09: 2 g via INTRAVENOUS

## 2012-01-09 MED ORDER — LACTATED RINGERS IV SOLN: INTRAVENOUS | Status: DC

## 2012-01-09 MED ORDER — ONDANSETRON HCL 4 MG/2ML IJ SOLN
INTRAMUSCULAR | Status: DC | PRN
  Administered 2012-01-09: 4 mg via INTRAVENOUS

## 2012-01-09 MED ORDER — LACTATED RINGERS IV SOLN
INTRAVENOUS | Status: DC
  Administered 2012-01-09 (×2): via INTRAVENOUS

## 2012-01-09 MED ORDER — LIDOCAINE HCL 1 % IJ SOLN
INTRAMUSCULAR | Status: DC | PRN
  Administered 2012-01-09: 10 mL

## 2012-01-09 MED ORDER — DEXTROSE 5 % IV SOLN
INTRAVENOUS | Status: DC | PRN
  Administered 2012-01-09: 1000 mL

## 2012-01-09 MED ORDER — ONDANSETRON HCL 4 MG/2ML IJ SOLN
INTRAMUSCULAR | Status: AC
  Filled 2012-01-09: qty 2

## 2012-01-09 MED ORDER — DEXAMETHASONE SODIUM PHOSPHATE 10 MG/ML IJ SOLN
INTRAMUSCULAR | Status: AC
  Filled 2012-01-09: qty 1

## 2012-01-09 MED ORDER — TEMAZEPAM 7.5 MG PO CAPS: 30.0000 mg | ORAL_CAPSULE | Freq: Every evening | ORAL | Status: DC | PRN

## 2012-01-09 MED ORDER — LIDOCAINE HCL (CARDIAC) 20 MG/ML IV SOLN
INTRAVENOUS | Status: DC | PRN
  Administered 2012-01-09: 50 mg via INTRAVENOUS

## 2012-01-09 MED ORDER — FENTANYL CITRATE 0.05 MG/ML IJ SOLN
25.0000 ug | INTRAMUSCULAR | Status: DC | PRN
  Administered 2012-01-09: 50 ug via INTRAVENOUS

## 2012-01-09 MED ORDER — OXYCODONE HCL 5 MG PO TABS: 15.0000 mg | ORAL_TABLET | ORAL | Status: DC | PRN

## 2012-01-09 MED ORDER — ACETAMINOPHEN 10 MG/ML IV SOLN
1000.0000 mg | Freq: Once | INTRAVENOUS | Status: AC | PRN
  Administered 2012-01-09: 1000 mg via INTRAVENOUS
  Filled 2012-01-09: qty 100

## 2012-01-09 MED ORDER — CEFAZOLIN SODIUM-DEXTROSE 2-3 GM-% IV SOLR
INTRAVENOUS | Status: AC
  Filled 2012-01-09: qty 50

## 2012-01-09 MED ORDER — DEXAMETHASONE SODIUM PHOSPHATE 10 MG/ML IJ SOLN
INTRAMUSCULAR | Status: DC | PRN
  Administered 2012-01-09: 10 mg via INTRAVENOUS

## 2012-01-09 MED ORDER — CLONAZEPAM 1 MG PO TABS: 1.0000 mg | ORAL_TABLET | Freq: Two times a day (BID) | ORAL | Status: DC | PRN

## 2012-01-09 MED ORDER — MIDAZOLAM HCL 2 MG/2ML IJ SOLN
INTRAMUSCULAR | Status: AC
  Filled 2012-01-09: qty 2

## 2012-01-09 SURGICAL SUPPLY — 15 items
CANISTER SUCTION 2500CC (MISCELLANEOUS) ×2 IMPLANT
CATH ROBINSON RED A/P 16FR (CATHETERS) ×2 IMPLANT
CATH THERMACHOICE III (CATHETERS) ×3 IMPLANT
CLOTH BEACON ORANGE TIMEOUT ST (SAFETY) ×2 IMPLANT
CONTAINER PREFILL 10% NBF 60ML (FORM) ×4 IMPLANT
DRESSING TELFA 8X3 (GAUZE/BANDAGES/DRESSINGS) ×2 IMPLANT
GLOVE BIO SURGEON STRL SZ 6.5 (GLOVE) ×4 IMPLANT
GLOVE BIOGEL PI IND STRL 7.0 (GLOVE) IMPLANT
GLOVE BIOGEL PI INDICATOR 7.0 (GLOVE) ×3
GLOVE SURG SS PI 7.0 STRL IVOR (GLOVE) ×3 IMPLANT
GOWN STRL REIN XL XLG (GOWN DISPOSABLE) ×4 IMPLANT
PACK HYSTEROSCOPY LF (CUSTOM PROCEDURE TRAY) ×2 IMPLANT
PAD OB MATERNITY 4.3X12.25 (PERSONAL CARE ITEMS) ×2 IMPLANT
TOWEL OR 17X24 6PK STRL BLUE (TOWEL DISPOSABLE) ×4 IMPLANT
WATER STERILE IRR 1000ML POUR (IV SOLUTION) ×2 IMPLANT

## 2012-01-09 NOTE — Anesthesia Preprocedure Evaluation (Signed)
Anesthesia Evaluation  Patient identified by MRN, date of birth, ID band Patient awake    Reviewed: Allergy & Precautions, H&P , NPO status , Patient's Chart, lab work & pertinent test results, reviewed documented beta blocker date and time   History of Anesthesia Complications Negative for: history of anesthetic complications  Airway Mallampati: II TM Distance: >3 FB Neck ROM: full    Dental  (+) Teeth Intact   Pulmonary neg pulmonary ROS,  breath sounds clear to auscultation  Pulmonary exam normal       Cardiovascular Exercise Tolerance: Good negative cardio ROS  Rhythm:regular Rate:Normal     Neuro/Psych  Headaches (headaches and migraines - relates to depression), PSYCHIATRIC DISORDERS (depression/anxiety)    GI/Hepatic negative GI ROS, Neg liver ROS,   Endo/Other  negative endocrine ROS  Renal/GU negative Renal ROS  Female GU complaint     Musculoskeletal   Abdominal   Peds  Hematology negative hematology ROS (+)   Anesthesia Other Findings Percocet with periods and headaches  Reproductive/Obstetrics negative OB ROS                           Anesthesia Physical Anesthesia Plan  ASA: II  Anesthesia Plan: General LMA   Post-op Pain Management:    Induction:   Airway Management Planned:   Additional Equipment:   Intra-op Plan:   Post-operative Plan:   Informed Consent: I have reviewed the patients History and Physical, chart, labs and discussed the procedure including the risks, benefits and alternatives for the proposed anesthesia with the patient or authorized representative who has indicated his/her understanding and acceptance.   Dental Advisory Given  Plan Discussed with: CRNA and Surgeon  Anesthesia Plan Comments:         Anesthesia Quick Evaluation

## 2012-01-09 NOTE — Op Note (Signed)
NAME:  Samantha Monroe, Samantha Monroe               ACCOUNT NO.:  [id number]  MEDICAL RECORD NO.:  [id number]  LOCATION:  [facility]                          FACILITY:  [facility]  PHYSICIAN:  Michell Kader L. Aileene Lanum, M.D.DATE OF BIRTH:  Oct 06, 1975  DATE OF PROCEDURE:  01/09/2012 DATE OF DISCHARGE:  01/09/2012                              OPERATIVE REPORT   PREOPERATIVE DIAGNOSIS:  Menorrhagia.  POSTOPERATIVE DIAGNOSIS:  Menorrhagia.  PROCEDURE:  D and C, hysteroscopy, and ThermaChoice endometrial ablation.  SURGEON:  Tashena Ibach L. Vincente Poli, M.D.  ANESTHESIA:  LMA with paracervical block.  EBL:  Minimal.  COMPLICATIONS:  None.  PROCEDURE:  The patient was taken to the operating room.  Her anesthesia was administered.  She was prepped and draped.  Time-out was performed. After the sterile drape had been applied, the speculum was inserted into the vagina.  The cervix was grasped with a tenaculum.  Paracervical block was performed in standard fashion.  The cervical internal os was gently dilated using Pratt dilators.  The diagnostic hysteroscope was inserted into the uterine cavity.  The endometrium appeared normal. There was no fibroid or polyp noted.  She did have a lot of tissue in there.  I could see her uterine window, there was no uterine window and the scar was intact.  Fluid deficit was -30.  We then removed the hysteroscope and then inserted the sharp curette and the uterus was thoroughly curetted of all tissue.  It was sent to Pathology.  The ThermaChoice balloon was inserted according to the manufacturer's specifications.  When we inserted and started the cycle, the balloon would not maintain pressure, we were not sure why.  We were only able to get 1 minute into the cycle and then the machine cut off.  We removed the balloon, the balloon appeared to be intact.  I then inserted the hysteroscope and there was no evidence of any kind of uterine perforation.  So, we felt like the balloon was faulty.  Then,  we decided to try another balloon, we used a new balloon and we were able to perform an uncomplicated easy 8-minute endometrial ablation, inserting about 15 mL of D5W in the balloon.  The cycle went for 8 minutes.  There was no loss of pressure throughout the cycle.  We feel like the other balloon was a manufacturer's defect that will be sent back to the manufacture and the patient will not be charged for that first balloon.  At the end of the procedure, the intact balloon was removed; all instruments were removed from the vagina; all sponge, lap, and instrument counts were correct x2.  The patient will go to recovery room in stable condition.     Krisna Omar L. Vincente Poli, M.D.     Florestine Avers  D:  01/09/2012  T:  01/09/2012  Job:  [id number]

## 2012-01-09 NOTE — Preoperative (Signed)
Beta Blockers   Reason not to administer Beta Blockers:Not Applicable 

## 2012-01-09 NOTE — Brief Op Note (Signed)
01/09/2012  8:03 AM  PATIENT:  Samantha Monroe  36 y.o. female  PRE-OPERATIVE DIAGNOSIS:  menorrhagia  POST-OPERATIVE DIAGNOSIS:  menorrhagia  PROCEDURE:  Procedure(s) (LRB) with comments: DILATATION & CURETTAGE/HYSTEROSCOPY WITH THERMACHOICE ABLATION (N/A)  SURGEON:  Surgeon(s) and Role:    * Jeani Hawking, MD - Primary  PHYSICIAN ASSISTANT:   ASSISTANTS: none   ANESTHESIA:   IV sedation and paracervical block  EBL:  Total I/O In: 1000 [I.V.:1000] Out: -   BLOOD ADMINISTERED:none  DRAINS: none   LOCAL MEDICATIONS USED:  XYLOCAINE   SPECIMEN:  Source of Specimen:  uterine currettings  DISPOSITION OF SPECIMEN:  PATHOLOGY  COUNTS:  YES  TOURNIQUET:  * No tourniquets in log *  DICTATION: .Other Dictation: Dictation Number [id number]  PLAN OF CARE: Discharge to home after PACU  PATIENT DISPOSITION:  PACU - hemodynamically stable.   Delay start of Pharmacological VTE agent (>24hrs) due to surgical blood loss or risk of bleeding: not applicable

## 2012-01-09 NOTE — Progress Notes (Signed)
History and physical on the chart. No significant changes Will proceed with D and C, Hysteroscopy, and Thermachoice endometrial ablation Consent signed.

## 2012-01-09 NOTE — Anesthesia Postprocedure Evaluation (Signed)
  Anesthesia Post-op Note  Patient: Samantha Monroe  Procedure(s) Performed: Procedure(s) (LRB) with comments: DILATATION & CURETTAGE/HYSTEROSCOPY WITH THERMACHOICE ABLATION (N/A)  Patient Location: PACU  Anesthesia Type:General  Level of Consciousness: awake, alert  and oriented  Airway and Oxygen Therapy: Patient Spontanous Breathing  Post-op Pain: none  Post-op Assessment: Post-op Vital signs reviewed, Patient's Cardiovascular Status Stable, Respiratory Function Stable, Patent Airway, No signs of Nausea or vomiting and Pain level controlled  Post-op Vital Signs: Reviewed and stable  Complications: No apparent anesthesia complications

## 2012-01-09 NOTE — Transfer of Care (Signed)
Immediate Anesthesia Transfer of Care Note  Patient: Samantha Monroe  Procedure(s) Performed: Procedure(s) (LRB) with comments: DILATATION & CURETTAGE/HYSTEROSCOPY WITH THERMACHOICE ABLATION (N/A)  Patient Location: PACU  Anesthesia Type:General  Level of Consciousness: awake, alert  and patient cooperative  Airway & Oxygen Therapy: Patient Spontanous Breathing and Patient connected to nasal cannula oxygen  Post-op Assessment: Report given to PACU RN  Post vital signs: Reviewed  Complications: No apparent anesthesia complications

## 2012-01-12 ENCOUNTER — Encounter (HOSPITAL_COMMUNITY): Payer: Self-pay | Admitting: Obstetrics and Gynecology

## 2012-05-17 ENCOUNTER — Other Ambulatory Visit: Payer: Self-pay | Admitting: Obstetrics and Gynecology

## 2013-02-25 ENCOUNTER — Emergency Department (HOSPITAL_BASED_OUTPATIENT_CLINIC_OR_DEPARTMENT_OTHER): Payer: Managed Care, Other (non HMO)

## 2013-02-25 ENCOUNTER — Emergency Department (HOSPITAL_BASED_OUTPATIENT_CLINIC_OR_DEPARTMENT_OTHER)
Admission: EM | Admit: 2013-02-25 | Discharge: 2013-02-25 | Disposition: A | Discharge status: Home / Self Care | Payer: Managed Care, Other (non HMO) | Attending: Emergency Medicine | Admitting: Emergency Medicine

## 2013-02-25 ENCOUNTER — Encounter (HOSPITAL_BASED_OUTPATIENT_CLINIC_OR_DEPARTMENT_OTHER): Payer: Self-pay | Admitting: Emergency Medicine

## 2013-02-25 ENCOUNTER — Other Ambulatory Visit: Payer: Self-pay

## 2013-02-25 DIAGNOSIS — R55 Syncope and collapse: Secondary | ICD-10-CM

## 2013-02-25 DIAGNOSIS — R569 Unspecified convulsions: Secondary | ICD-10-CM

## 2013-02-25 DIAGNOSIS — R42 Dizziness and giddiness: Secondary | ICD-10-CM | POA: Insufficient documentation

## 2013-02-25 DIAGNOSIS — F411 Generalized anxiety disorder: Secondary | ICD-10-CM | POA: Insufficient documentation

## 2013-02-25 DIAGNOSIS — Z3202 Encounter for pregnancy test, result negative: Secondary | ICD-10-CM | POA: Insufficient documentation

## 2013-02-25 DIAGNOSIS — G40909 Epilepsy, unspecified, not intractable, without status epilepticus: Secondary | ICD-10-CM | POA: Insufficient documentation

## 2013-02-25 DIAGNOSIS — F3289 Other specified depressive episodes: Secondary | ICD-10-CM | POA: Insufficient documentation

## 2013-02-25 DIAGNOSIS — F329 Major depressive disorder, single episode, unspecified: Secondary | ICD-10-CM | POA: Insufficient documentation

## 2013-02-25 DIAGNOSIS — Z8639 Personal history of other endocrine, nutritional and metabolic disease: Secondary | ICD-10-CM | POA: Insufficient documentation

## 2013-02-25 DIAGNOSIS — Z862 Personal history of diseases of the blood and blood-forming organs and certain disorders involving the immune mechanism: Secondary | ICD-10-CM | POA: Insufficient documentation

## 2013-02-25 DIAGNOSIS — Z79899 Other long term (current) drug therapy: Secondary | ICD-10-CM | POA: Insufficient documentation

## 2013-02-25 LAB — URINALYSIS, ROUTINE W REFLEX MICROSCOPIC
BILIRUBIN URINE: NEGATIVE
GLUCOSE, UA: NEGATIVE mg/dL
HGB URINE DIPSTICK: NEGATIVE
KETONES UR: NEGATIVE mg/dL
Leukocytes, UA: NEGATIVE
Nitrite: NEGATIVE
PROTEIN: 100 mg/dL — AB
Specific Gravity, Urine: 1.024 (ref 1.005–1.030)
Urobilinogen, UA: 0.2 mg/dL (ref 0.0–1.0)
pH: 6 (ref 5.0–8.0)

## 2013-02-25 LAB — COMPREHENSIVE METABOLIC PANEL
ALBUMIN: 4.5 g/dL (ref 3.5–5.2)
ALT: 16 U/L (ref 0–35)
AST: 24 U/L (ref 0–37)
Alkaline Phosphatase: 78 U/L (ref 39–117)
BILIRUBIN TOTAL: 0.3 mg/dL (ref 0.3–1.2)
BUN: 11 mg/dL (ref 6–23)
CALCIUM: 9.5 mg/dL (ref 8.4–10.5)
CHLORIDE: 96 meq/L (ref 96–112)
CO2: 18 mEq/L — ABNORMAL LOW (ref 19–32)
CREATININE: 0.6 mg/dL (ref 0.50–1.10)
GFR calc Af Amer: >90 mL/min (ref >90)
Glucose, Bld: 98 mg/dL (ref 70–99)
Potassium: 4 mEq/L (ref 3.7–5.3)
Sodium: 137 mEq/L (ref 137–147)
Total Protein: 8.6 g/dL — ABNORMAL HIGH (ref 6.0–8.3)

## 2013-02-25 LAB — CBC
HEMATOCRIT: 39 % (ref 36.0–46.0)
Hemoglobin: 12.6 g/dL (ref 12.0–15.0)
MCH: 27.3 pg (ref 26.0–34.0)
MCHC: 32.3 g/dL (ref 30.0–36.0)
MCV: 84.6 fL (ref 78.0–100.0)
Platelets: 304 10*3/uL (ref 150–400)
RBC: 4.61 MIL/uL (ref 3.87–5.11)
RDW: 13.6 % (ref 11.5–15.5)
WBC: 8.6 10*3/uL (ref 4.0–10.5)

## 2013-02-25 LAB — URINE MICROSCOPIC-ADD ON

## 2013-02-25 LAB — PREGNANCY, URINE: Preg Test, Ur: NEGATIVE

## 2013-02-25 MED ORDER — SODIUM CHLORIDE 0.9 % IV BOLUS (SEPSIS)
1000.0000 mL | Freq: Once | INTRAVENOUS | Status: AC
  Administered 2013-02-25: 1000 mL via INTRAVENOUS

## 2013-02-25 NOTE — Discharge Instructions (Signed)
No driving until reevaluated by neurology or your primary care physician. Stay hydrated. Return here with any recurrence.  Syncope Syncope means a person passes out (faints). The person usually wakes up in less than 5 minutes. It is important to seek medical care for syncope. HOME CARE  Have someone stay with you until you feel normal.  Do not drive, use machines, or play sports until your doctor says it is okay.  Keep all doctor visits as told.  Lie down when you feel like you might pass out. Take deep breaths. Wait until you feel normal before standing up.  Drink enough fluids to keep your pee (urine) clear or pale yellow.  If you take blood pressure or heart medicine, get up slowly. Take several minutes to sit and then stand. GET HELP RIGHT AWAY IF:   You have a severe headache.  You have pain in the chest, belly (abdomen), or back.  You are bleeding from the mouth or butt (rectum).  You have black or tarry poop (stool).  You have an irregular or very fast heartbeat.  You have pain with breathing.  You keep passing out, or you have shaking (seizures) when you pass out.  You pass out when sitting or lying down.  You feel confused.  You have trouble walking.  You have severe weakness.  You have vision problems. If you fainted, call your local emergency services (911 in [place] Do not drive yourself to the hospital. MAKE SURE YOU:   Understand these instructions.  Will watch your condition.  Will get help right away if you are not doing well or get worse. Document Released: 07/23/2007 Document Revised: 08/05/2011 Document Reviewed: 04/04/2011 [facility] Patient Information 2014 [place], [place].  Seizure, Adult A seizure means there is unusual activity in the brain. A seizure can cause changes in attention or behavior. Seizures often cause shaking (convulsions). Seizures often last from 30 seconds to 2 minutes. HOME CARE   If you are given medicines, take them  exactly as told by your doctor.  Keep all doctor visits as told.  Do not swim or drive until your doctor says it is okay.  Teach others what to do if you have a seizure. They should:  Lay you on the ground.  Put a cushion under your head.  Loosen any tight clothing around your neck.  Turn you on your side.  Stay with you until you get better. GET HELP RIGHT AWAY IF:   The seizure lasts longer than 2 to 5 minutes.  The seizure is very bad.  The person does not wake up after the seizure.  The person's attention or behavior changes. Drive the person to the emergency room or call your local emergency services (911 in [place] MAKE SURE YOU:   Understand these instructions.  Will watch your condition.  Will get help right away if you are not doing well or get worse. Document Released: 07/23/2007 Document Revised: 04/28/2011 Document Reviewed: 01/22/2011 [facility] Patient Information 2014 [place].

## 2013-02-25 NOTE — ED Provider Notes (Signed)
CSN: [id number]     Arrival date & time 02/25/13  1139 History   First MD Initiated Contact with Patient 02/25/13 1157     Chief Complaint  Patient presents with  . Seizures    HPI  Patient presents after an episode at work where she became unconscious. 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 She had not eaten this morning. She states she started feeling lightheaded and dizzy like she was going to pass out. No headache or sudden pain. She states her members lowering herself towards the ground. She apparently did have loss of consciousness. She did not bite her tongue. She did not herself. She was not incontinent. She remembers picking with a Animal nutritionist and then with EMS. Shortly after the patient's arrival here, a coworker named Gerald Stabs arrives. He states that he was there within 5 minutes of the episode and she was trying to sit up, and talked to the officer.Marland Kitchen He does not describe her as being confused or postictal period he did not  directly speak with her.  She states she's had episodes of orthostasis and orthostatic syncope in the past present meds seizure activity. No history of injuries. History of CNS infections. She does take Klonopin on a fairly regular basis. Has had no change in this recently. Has not had withdrawal symptoms. No history of drug use.  Past Medical History  Diagnosis Date  . Depression   . High cholesterol   . High triglycerides   . Anxiety    Past Surgical History  Procedure Laterality Date  . Tubal ligation    . Cesarean section    . Appendectomy    . Dilitation & currettage/hystroscopy with thermachoice ablation  01/09/2012    Procedure: DILATATION & CURETTAGE/HYSTEROSCOPY WITH THERMACHOICE ABLATION;  Surgeon: Cyril Mourning, MD;  Location: [facility];  Service: Gynecology;  Laterality: N/A;   No family history on file. History  Substance Use Topics  . Smoking status: Never Smoker   . Smokeless tobacco: Not on file  . Alcohol Use:  Yes     Comment: occasional   OB History   Grav Para Term Preterm Abortions TAB SAB Ect Mult Living                 Review of Systems  Constitutional: Negative for fever, chills, diaphoresis, appetite change and fatigue.  HENT: Negative for mouth sores, sore throat and trouble swallowing.   Eyes: Negative for visual disturbance.  Respiratory: Negative for cough, chest tightness, shortness of breath and wheezing.   Cardiovascular: Negative for chest pain.  Gastrointestinal: Negative for nausea, vomiting, abdominal pain, diarrhea and abdominal distention.  Endocrine: Negative for polydipsia, polyphagia and polyuria.  Genitourinary: Negative for dysuria, frequency and hematuria.  Musculoskeletal: Negative for gait problem.  Skin: Negative for color change, pallor and rash.  Neurological: Positive for syncope and light-headedness. Negative for dizziness and headaches.  Hematological: Does not bruise/bleed easily.  Psychiatric/Behavioral: Negative for behavioral problems and confusion.    Allergies  Aspirin; Motrin; and Shellfish allergy  Home Medications   Current Outpatient Rx  Name  Route  Sig  Dispense  Refill  . HYDROcodone-acetaminophen (NORCO) 7.5-325 MG per tablet   Oral   Take 1 tablet by mouth every 6 (six) hours as needed for moderate pain.         . clonazePAM (KLONOPIN) 1 MG tablet   Oral   Take 1 mg by mouth 2 (two) times daily.         Marland Kitchen  clonazePAM (KLONOPIN) 1 MG tablet   Oral   Take 1 tablet (1 mg total) by mouth 2 (two) times daily as needed for anxiety.   60 tablet   0   . escitalopram (LEXAPRO) 20 MG tablet   Oral   Take 20 mg by mouth daily.         Marland Kitchen oxyCODONE (ROXICODONE) 5 MG immediate release tablet   Oral   Take 3 tablets (15 mg total) by mouth every 4 (four) hours as needed for pain.   20 tablet   0   . temazepam (RESTORIL) 7.5 MG capsule   Oral   Take 4 capsules (30 mg total) by mouth at bedtime as needed for sleep.   30 capsule    0   . zolpidem (AMBIEN) 10 MG tablet   Oral   Take 10 mg by mouth at bedtime as needed. For insomina          BP 162/106  Pulse 104  Temp(Src) 98.2 F (36.8 C) (Oral)  Resp 16  Ht 5\' 3"  (1.6 m)  Wt 145 lb (65.772 kg)  BMI 25.69 kg/m2  SpO2 97%  LMP 02/11/2013 Physical Exam  Constitutional: She is oriented to person, place, and time. She appears well-developed and well-nourished. No distress.  HENT:  Head: Normocephalic.  Eyes: Conjunctivae are normal. Pupils are equal, round, and reactive to light. No scleral icterus.  Neck: Normal range of motion. Neck supple. No thyromegaly present.  Cardiovascular: Normal rate and regular rhythm.  Exam reveals no gallop and no friction rub.   No murmur heard. Pulmonary/Chest: Effort normal and breath sounds normal. No respiratory distress. She has no wheezes. She has no rales.  Abdominal: Soft. Bowel sounds are normal. She exhibits no distension. There is no tenderness. There is no rebound.  Musculoskeletal: Normal range of motion.  Neurological: She is alert and oriented to person, place, and time.  Nystagmus. Normal cranial nerves. No talked weakness. Normal neurological exam. No nystagmus.  Skin: Skin is warm and dry. No rash noted.  Psychiatric: She has a normal mood and affect. Her behavior is normal.    ED Course  Procedures (including critical care time) Labs Review Labs Reviewed  URINALYSIS, ROUTINE W REFLEX MICROSCOPIC - Abnormal; Notable for the following:    APPearance CLOUDY (*)    Protein, ur 100 (*)    All other components within normal limits  COMPREHENSIVE METABOLIC PANEL - Abnormal; Notable for the following:    CO2 18 (*)    Total Protein 8.6 (*)    All other components within normal limits  URINE MICROSCOPIC-ADD ON - Abnormal; Notable for the following:    Squamous Epithelial / LPF MANY (*)    All other components within normal limits  PREGNANCY, URINE  CBC   Imaging Review Ct Head Wo Contrast  02/25/2013    CLINICAL DATA:  Headache. Vertigo. Hypertension. Possible seizure.  EXAM: CT HEAD WITHOUT CONTRAST  TECHNIQUE: Contiguous axial images were obtained from the base of the skull through the vertex without intravenous contrast.  COMPARISON:  MR HEAD W/O CM dated 11/01/2003; CT HEAD W/O CM dated 09/30/2003  FINDINGS: Sinuses/Soft tissues: Clear paranasal sinuses and mastoid air cells.  Intracranial: No mass lesion, hemorrhage, hydrocephalus, acute infarct, intra-axial, or extra-axial fluid collection.  IMPRESSION: Normal head CT.   Electronically Signed   By: Abigail Miyamoto M.D.   On: 02/25/2013 13:17    EKG Interpretation   None       MDM  1. Seizure   2. Syncope    EKG shows no acute abnormality. Normal CT scan her blood pressure was improved recheck 153/103. Have advised her to have this rechecked. An oximetry of her hypertension at this time. Vascular followup with her primary care physician. I am unable to determine if this were actual seizure. She had an aura of lightheadedness which makes it unlikely. She was described as having "shaking". I cannot ascertain if she actually had a postictal period. I think is appropriate for outpatient care. Advised her not to drive or take in activities that would be dangerous should she have syncope or seizure. Given her neurology referral and also referred her back to her primary care physician.     Tanna Furry, MD 02/25/13 1430

## 2013-02-25 NOTE — ED Notes (Signed)
Pt is awake and alert, talking with family at bedside, denies any pain or any other c/o.

## 2013-02-25 NOTE — ED Notes (Signed)
Pt reports feeling dizzy and like she was going to pass out.  Per EMS witnessed seizure lasting 3-5 minutes.

## 2013-02-28 ENCOUNTER — Encounter: Payer: Self-pay | Admitting: Diagnostic Neuroimaging

## 2013-02-28 ENCOUNTER — Ambulatory Visit (INDEPENDENT_AMBULATORY_CARE_PROVIDER_SITE_OTHER): Payer: Managed Care, Other (non HMO) | Admitting: Diagnostic Neuroimaging

## 2013-02-28 VITALS — BP 141/101 | HR 80 | Temp 97.9°F | Ht 62.0 in | Wt 156.0 lb

## 2013-02-28 DIAGNOSIS — R569 Unspecified convulsions: Secondary | ICD-10-CM

## 2013-02-28 DIAGNOSIS — G43909 Migraine, unspecified, not intractable, without status migrainosus: Secondary | ICD-10-CM

## 2013-02-28 MED ORDER — SUMATRIPTAN SUCCINATE 100 MG PO TABS: 100.0000 mg | ORAL_TABLET | Freq: Once | ORAL | Status: DC | PRN

## 2013-02-28 MED ORDER — TOPIRAMATE 50 MG PO TABS: 50.0000 mg | ORAL_TABLET | Freq: Two times a day (BID) | ORAL | Status: DC

## 2013-02-28 NOTE — Patient Instructions (Signed)
Start topiramate 50 mg at bedtime. After 2 weeks increase to 1 tablet twice a day.  Used sumatriptan 100 mg as needed at onset of migraine headache. May repeat times one after 2 hours. Maximum 2 tablets in 24 hours. Maximum 8 tablets per month.  I will check MRI and EEG testing.  No driving until seizure free x6 months. Last seizure on 02/25/13.

## 2013-02-28 NOTE — Progress Notes (Signed)
[facility]  PATIENT: Samantha Monroe DOB: January 16, 1976  REFERRING CLINICIAN: ER HISTORY FROM: patient REASON FOR VISIT: new consult   HISTORICAL  CHIEF COMPLAINT:  Chief Complaint  Patient presents with  . Seizures    HISTORY OF PRESENT ILLNESS:   38 year old right-handed female here for evaluation of seizure.  02/25/13 patient was at work going to [employer] at 11 AM when she felt faint and lightheaded. She felt somewhat hot. She felt herself fall to the ground and then have convulsions. Witnesses saw her pass out and have generalized convulsions. Bystander who happened to be and EMT witnessed this and called it a "seizure". Convulsions lasted 3-5 minutes. No tongue biting or incontinence. Patient was amnestic after the event until she arrived at the hospital by ambulance. Patient was evaluated with CT scan, lab testing and discharged home. Patient continues to have generalized muscle soreness and jaw pain.   Patient does not normally breakfast in the mornings.  Patient is on long-term medications including clonazepam 1 mg twice a day. She is on Lexapro and Ambien as well. 2 days prior to this event she had taken some muscle relaxant for neck pain, but not the day of the event. Patient drinks 2 or 3 beers per day, 3 times per week. Patient had no alcohol a week prior to this event. No change in sleep pattern recently. No fevers or chills.  Patient also reports long history of intermittent headaches associated with blurred vision, throbbing sensation, nausea vomiting. Headaches occur one week out of the month. They're usually associated with her menstrual cycle.   REVIEW OF SYSTEMS: Full 14 system review of systems performed and notable only for  headache insomnia dizziness seizure depression anxiety joint pain ringing in ears.   ALLERGIES: Allergies  Allergen Reactions  . Aspirin Other (See Comments)    unknown  . Motrin [Ibuprofen]   . Shellfish Allergy  Other (See Comments)    unknown    HOME MEDICATIONS: Outpatient Prescriptions Prior to Visit  Medication Sig Dispense Refill  . clonazePAM (KLONOPIN) 1 MG tablet Take 1 tablet (1 mg total) by mouth 2 (two) times daily as needed for anxiety.  60 tablet  0  . escitalopram (LEXAPRO) 20 MG tablet Take 20 mg by mouth daily.      Marland Kitchen HYDROcodone-acetaminophen (NORCO) 7.5-325 MG per tablet Take 1 tablet by mouth every 6 (six) hours as needed for moderate pain.      Marland Kitchen oxyCODONE (ROXICODONE) 5 MG immediate release tablet Take 3 tablets (15 mg total) by mouth every 4 (four) hours as needed for pain.  20 tablet  0  . zolpidem (AMBIEN) 10 MG tablet Take 10 mg by mouth at bedtime as needed. For insomina      . clonazePAM (KLONOPIN) 1 MG tablet Take 1 mg by mouth 2 (two) times daily.      . temazepam (RESTORIL) 7.5 MG capsule Take 4 capsules (30 mg total) by mouth at bedtime as needed for sleep.  30 capsule  0   No facility-administered medications prior to visit.    PAST MEDICAL HISTORY: Past Medical History  Diagnosis Date  . Depression   . High cholesterol   . High triglycerides   . Anxiety     PAST SURGICAL HISTORY: Past Surgical History  Procedure Laterality Date  . Tubal ligation    . Cesarean section    . Appendectomy    . Dilitation & currettage/hystroscopy with thermachoice ablation  01/09/2012  Procedure: DILATATION & CURETTAGE/HYSTEROSCOPY WITH THERMACHOICE ABLATION;  Surgeon: Cyril Mourning, MD;  Location: [facility];  Service: Gynecology;  Laterality: N/A;    FAMILY HISTORY: Family History  Problem Relation Age of Onset  . High Cholesterol    . Heart disease Maternal Aunt     SOCIAL HISTORY:  History   Social History  . Marital Status: Legally Separated    Spouse Name: N/A    Number of Children: 3  . Years of Education: 12th   Occupational History  . 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   Social History Main Topics  . Smoking status: Never Smoker   . Smokeless  tobacco: Never Used  . Alcohol Use: Yes     Comment: occasional/ 2 or 3 weekly  . Drug Use: No  . Sexual Activity: Yes    Birth Control/ Protection: None   Other Topics Concern  . Not on file   Social History Narrative   Patient lives at home with children.   Caffeine Use: 1 cup of coffee daily     PHYSICAL EXAM  Filed Vitals:   02/28/13 0915  BP: 141/101  Pulse: 80  Temp: 97.9 F (36.6 C)  TempSrc: Oral  Height: 5\' 2"  (1.575 m)  Weight: 156 lb (70.761 kg)    Not recorded    Body mass index is 28.53 kg/(m^2).  GENERAL EXAM: Patient is in no distress; well developed, nourished and groomed; neck is supple  CARDIOVASCULAR: Regular rate and rhythm, no murmurs, no carotid bruits  NEUROLOGIC: MENTAL STATUS: awake, alert, oriented to person, place and time, recent and remote memory intact, normal attention and concentration, language fluent, comprehension intact, naming intact, fund of knowledge appropriate CRANIAL NERVE: no papilledema on fundoscopic exam, pupils equal and reactive to light, visual fields full to confrontation, extraocular muscles intact, no nystagmus, facial sensation and strength symmetric, hearing intact, palate elevates symmetrically, uvula midline, shoulder shrug symmetric, tongue midline. MOTOR: normal bulk and tone, full strength in the BUE, BLE SENSORY: normal and symmetric to light touch, pinprick, temperature, vibration COORDINATION: finger-nose-finger, fine finger movements normal REFLEXES: deep tendon reflexes present and symmetric GAIT/STATION: narrow based gait; able to walk on toes, heels and tandem; romberg is negative    DIAGNOSTIC DATA (LABS, IMAGING, TESTING) - I reviewed patient records, labs, notes, testing and imaging myself where available.  Lab Results  Component Value Date   WBC 8.6 02/25/2013   HGB 12.6 02/25/2013   HCT 39.0 02/25/2013   MCV 84.6 02/25/2013   PLT 304 02/25/2013      Component Value Date/Time   NA 137 02/25/2013  1155   K 4.0 02/25/2013 1155   CL 96 02/25/2013 1155   CO2 18* 02/25/2013 1155   GLUCOSE 98 02/25/2013 1155   BUN 11 02/25/2013 1155   CREATININE 0.60 02/25/2013 1155   CALCIUM 9.5 02/25/2013 1155   PROT 8.6* 02/25/2013 1155   ALBUMIN 4.5 02/25/2013 1155   AST 24 02/25/2013 1155   ALT 16 02/25/2013 1155   ALKPHOS 78 02/25/2013 1155   BILITOT 0.3 02/25/2013 1155   GFRNONAA >90 02/25/2013 1155   GFRAA >90 02/25/2013 1155   Lab Results  Component Value Date   CHOL 302* 04/09/2010   HDL 62.70 04/09/2010   LDLDIRECT 233.7 04/09/2010   TRIG 166.0* 04/09/2010   CHOLHDL 5 04/09/2010   No results found for this basename: HGBA1C   No results found for this basename: VITAMINB12   No results found for this basename: TSH  I reviewed images myself and agree with interpretation.   02/25/13 CT head - normal   ASSESSMENT AND PLAN  38 y.o. year old female here with new onset convulsions, LOC, amnesia following the event. Suspicious for generalized seizure (unprovoked). Ddx includes convulsive syncope. Also with HA that sound migrainous.   PLAN: - MRI brain, EEG - no anti-seizure meds for now (since it is first of life); unless MRI or EEG are abnl - no driving until sz free x 6 months - start migraine treatments (TPX + sumatriptan prn)  Orders Placed This Encounter  Procedures  . MR Brain W Wo Contrast  . EEG adult   Return in about 3 months (around 05/29/2013) for with Charlott Holler or [place].  I reviewed images, labs, notes, records myself. I summarized findings and reviewed with patient, for this high risk condition (new onset seizure) requiring high complexity decision making.   Penni Bombard, MD [id number], [id number] AM Certified in Neurology, Neurophysiology and Neuroimaging  [facility] [address], [place], [place] [address] [phone]

## 2013-03-02 ENCOUNTER — Ambulatory Visit (INDEPENDENT_AMBULATORY_CARE_PROVIDER_SITE_OTHER): Payer: Managed Care, Other (non HMO)

## 2013-03-02 DIAGNOSIS — G43909 Migraine, unspecified, not intractable, without status migrainosus: Secondary | ICD-10-CM

## 2013-03-02 DIAGNOSIS — R569 Unspecified convulsions: Secondary | ICD-10-CM

## 2013-03-04 ENCOUNTER — Telehealth: Payer: Self-pay | Admitting: Diagnostic Neuroimaging

## 2013-03-04 NOTE — Telephone Encounter (Signed)
PT called in wanting to get the results of the EEG she had on Wednesday, 1/14.  Please call.

## 2013-03-07 NOTE — Telephone Encounter (Signed)
Called patient and left message about her EEG results not being ready. I advised that patient that as soon as the results come in that someone would be contacting her. I advised that patient that if she has any other problems, questions or concerns to call the office.

## 2013-03-08 ENCOUNTER — Telehealth: Payer: Self-pay | Admitting: Diagnostic Neuroimaging

## 2013-03-08 NOTE — Telephone Encounter (Signed)
Calling for results of test last week

## 2013-03-08 NOTE — Telephone Encounter (Signed)
Pt returned call. Advised patient of what the call was about. She understood and will be waiting on the call for the results

## 2013-03-08 NOTE — Telephone Encounter (Signed)
Called patient to inform that EEG results are not ready as of yet, will call back when ready and reviewed by physician, left VM message.

## 2013-03-14 ENCOUNTER — Telehealth: Payer: Self-pay | Admitting: Diagnostic Neuroimaging

## 2013-03-15 NOTE — Telephone Encounter (Signed)
Patient is calling back for her EEG results, which was done on 03/02/13.  Please read, report is on your desk.  Thanks.

## 2013-03-24 ENCOUNTER — Ambulatory Visit (INDEPENDENT_AMBULATORY_CARE_PROVIDER_SITE_OTHER): Payer: Managed Care, Other (non HMO)

## 2013-03-24 DIAGNOSIS — G43909 Migraine, unspecified, not intractable, without status migrainosus: Secondary | ICD-10-CM

## 2013-03-24 DIAGNOSIS — R569 Unspecified convulsions: Secondary | ICD-10-CM

## 2013-03-24 MED ORDER — GADOPENTETATE DIMEGLUMINE 469.01 MG/ML IV SOLN: 15.0000 mL | Freq: Once | INTRAVENOUS | Status: AC | PRN

## 2013-03-31 NOTE — Procedures (Signed)
   [facility]  EEG (ELECTROENCEPHALOGRAM) REPORT   STUDY DATE: 03/02/13 PATIENT NAME: Samantha Monroe DOB: 10-09-1975 MRN: [id number]  ORDERING CLINICIAN: Andrey Spearman, MD   TECHNOLOGIST: Andrey Spearman, MD  TECHNIQUE: Electroencephalogram was recorded utilizing standard 10-20 system of lead placement and reformatted into average and bipolar montages.  RECORDING TIME: 32 minutes ACTIVATION: hyperventilation and photic stimulation  CLINICAL INFORMATION: 38 year old female with new onset seizure on 02/25/13.  FINDINGS: Background rhythms of 10-11 hertz and 50-60 microvolts. No focal, lateralizing, epileptiform activity or seizures are seen. Patient recorded in the awake state.  IMPRESSION:  Normal EEG in the awake state.   INTERPRETING PHYSICIAN:  Penni Bombard, MD Certified in Neurology, Neurophysiology and Neuroimaging  [facility] [address] [place], [place] [address] [phone]

## 2013-04-05 NOTE — Telephone Encounter (Signed)
Called patient. No answer. Left vmail.  

## 2013-04-05 NOTE — Telephone Encounter (Signed)
pls call patient with normal EEG and MRI results. Continue current plan as per last note. -VRP

## 2013-05-24 ENCOUNTER — Other Ambulatory Visit: Payer: Self-pay | Admitting: Obstetrics and Gynecology

## 2013-06-01 ENCOUNTER — Telehealth: Payer: Self-pay | Admitting: Nurse Practitioner

## 2013-06-01 ENCOUNTER — Ambulatory Visit: Payer: Managed Care, Other (non HMO) | Admitting: Nurse Practitioner

## 2013-06-01 NOTE — Telephone Encounter (Signed)
Patient was no show for today's office appointment.  

## 2013-07-07 ENCOUNTER — Emergency Department (HOSPITAL_COMMUNITY)
Admission: EM | Admit: 2013-07-07 | Discharge: 2013-07-08 | Disposition: A | Discharge status: Home / Self Care | Payer: Managed Care, Other (non HMO) | Attending: Emergency Medicine | Admitting: Emergency Medicine

## 2013-07-07 ENCOUNTER — Emergency Department (HOSPITAL_COMMUNITY): Payer: Managed Care, Other (non HMO)

## 2013-07-07 ENCOUNTER — Encounter (HOSPITAL_COMMUNITY): Payer: Self-pay | Admitting: Emergency Medicine

## 2013-07-07 DIAGNOSIS — F329 Major depressive disorder, single episode, unspecified: Secondary | ICD-10-CM | POA: Insufficient documentation

## 2013-07-07 DIAGNOSIS — R5381 Other malaise: Secondary | ICD-10-CM | POA: Insufficient documentation

## 2013-07-07 DIAGNOSIS — R569 Unspecified convulsions: Secondary | ICD-10-CM

## 2013-07-07 DIAGNOSIS — F411 Generalized anxiety disorder: Secondary | ICD-10-CM | POA: Insufficient documentation

## 2013-07-07 DIAGNOSIS — G40909 Epilepsy, unspecified, not intractable, without status epilepticus: Secondary | ICD-10-CM | POA: Insufficient documentation

## 2013-07-07 DIAGNOSIS — Z8639 Personal history of other endocrine, nutritional and metabolic disease: Secondary | ICD-10-CM | POA: Insufficient documentation

## 2013-07-07 DIAGNOSIS — F3289 Other specified depressive episodes: Secondary | ICD-10-CM | POA: Insufficient documentation

## 2013-07-07 DIAGNOSIS — Z862 Personal history of diseases of the blood and blood-forming organs and certain disorders involving the immune mechanism: Secondary | ICD-10-CM | POA: Insufficient documentation

## 2013-07-07 DIAGNOSIS — Z79899 Other long term (current) drug therapy: Secondary | ICD-10-CM | POA: Insufficient documentation

## 2013-07-07 DIAGNOSIS — Z3202 Encounter for pregnancy test, result negative: Secondary | ICD-10-CM | POA: Insufficient documentation

## 2013-07-07 DIAGNOSIS — R5383 Other fatigue: Secondary | ICD-10-CM

## 2013-07-07 HISTORY — DX: Unspecified convulsions: R56.9

## 2013-07-07 LAB — CBG MONITORING, ED: Glucose-Capillary: 95 mg/dL (ref 70–99)

## 2013-07-07 LAB — POC URINE PREG, ED: PREG TEST UR: NEGATIVE

## 2013-07-07 NOTE — ED Notes (Signed)
Per pt's friend: was on the phone with pt 20-30 minutes ago, pt stopped talking, pt's daughter in the room told friend she was shaking all over, daughter placed pt on side, pt's friend arrived to house found pt on right side with delayed answers to friend. Pt has no memory of situation. Pt stats last thing she remembers was sitting on the cough watching TV. No loose of bowel or bladder, no oral trama. Pt denies pain. Pt's last and only seizure was January 9th 2015 at work.

## 2013-07-07 NOTE — ED Notes (Signed)
Pt is currently in CT at this time.

## 2013-07-08 LAB — I-STAT CHEM 8, ED
BUN: 11 mg/dL (ref 6–23)
CHLORIDE: 108 meq/L (ref 96–112)
Calcium, Ion: 1.17 mmol/L (ref 1.12–1.23)
Creatinine, Ser: 0.8 mg/dL (ref 0.50–1.10)
Glucose, Bld: 100 mg/dL — ABNORMAL HIGH (ref 70–99)
HCT: 42 % (ref 36.0–46.0)
Hemoglobin: 14.3 g/dL (ref 12.0–15.0)
Potassium: 3.6 mEq/L — ABNORMAL LOW (ref 3.7–5.3)
SODIUM: 141 meq/L (ref 137–147)
TCO2: 23 mmol/L (ref 0–100)

## 2013-07-08 MED ORDER — TOPIRAMATE 25 MG PO TABS: 25.0000 mg | ORAL_TABLET | Freq: Two times a day (BID) | ORAL | Status: DC

## 2013-07-08 NOTE — Discharge Instructions (Signed)
Take max total 75 mg twice daily and avoid alcohol. Followup closely with neurology outpatient. If you have recurrent seizures her seizures last more than 5 minutes return to the ER.  If you were given medicines take as directed.  If you are on coumadin or contraceptives realize their levels and effectiveness is altered by many different medicines.  If you have any reaction (rash, tongues swelling, other) to the medicines stop taking and see a physician.  Followup for repeat of your blood pressure. Please follow up as directed and return to the ER or see a physician for new or worsening symptoms.  Thank you. Filed Vitals:   07/07/13 2219  BP: 164/107  Pulse: 99  Temp: 98.2 F (36.8 C)  TempSrc: Oral  Resp: 20  Weight: 151 lb 8 oz (68.72 kg)  SpO2: 98%    Driving and Equipment Restrictions Some medical problems make it dangerous to drive, ride a bike, or use machines. Some of these problems are:  A hard blow to the head (concussion).  Passing out (fainting).  Twitching and shaking (seizures).  Low blood sugar.  Taking medicine to help you relax (sedatives).  Taking pain medicines.  Wearing an eye patch.  Wearing splints. This can make it hard to use parts of your body that you need to drive safely. HOME CARE   Do not drive until your doctor says it is okay.  Do not use machines until your doctor says it is okay. You may need a form signed by your doctor (medical release) before you can drive again. You may also need this form before you do other tasks where you need to be fully alert. MAKE SURE YOU:  Understand these instructions.  Will watch your condition.  Will get help right away if you are not doing well or get worse. Document Released: 03/13/2004 Document Revised: 04/28/2011 Document Reviewed: 06/13/2009 [facility] Patient Information 2014 [place].

## 2013-07-08 NOTE — ED Provider Notes (Signed)
CSN: [id number]     Arrival date & time 07/07/13  2209 History   First MD Initiated Contact with Patient 07/07/13 2327     Chief Complaint  Patient presents with  . Seizures     (Consider location/radiation/quality/duration/timing/severity/associated sxs/prior Treatment) HPI Comments: 38 year old female with lipids and one seizure history presents after witnessed two-minute seizure activity generalized prior to arrival. Similar to previous in January. Patient is on Topamax twice daily. Patient does not have a neurologist. Patient denies significant head injury recent infection, legal drugs, significant alcohol history area patient did have a few alcohol beverages yesterday. Patient was mild fatigue afterwards however has no symptoms currently.  Patient is a 38 y.o. female presenting with seizures. The history is provided by the patient and a relative.  Seizures   Past Medical History  Diagnosis Date  . Depression   . High cholesterol   . High triglycerides   . Anxiety   . Seizures    Past Surgical History  Procedure Laterality Date  . Tubal ligation    . Cesarean section    . Appendectomy    . Dilitation & currettage/hystroscopy with thermachoice ablation  01/09/2012    Procedure: DILATATION & CURETTAGE/HYSTEROSCOPY WITH THERMACHOICE ABLATION;  Surgeon: Cyril Mourning, MD;  Location: [facility];  Service: Gynecology;  Laterality: N/A;   Family History  Problem Relation Age of Onset  . High Cholesterol    . Heart disease Maternal Aunt    History  Substance Use Topics  . Smoking status: Never Smoker   . Smokeless tobacco: Never Used  . Alcohol Use: Yes     Comment: occasional/ 2 or 3 weekly   OB History   Grav Para Term Preterm Abortions TAB SAB Ect Mult Living                 Review of Systems  Constitutional: Positive for fatigue. Negative for fever and chills.  HENT: Negative for congestion.   Eyes: Negative for visual disturbance.  Respiratory: Negative for  shortness of breath.   Cardiovascular: Negative for chest pain.  Gastrointestinal: Negative for vomiting and abdominal pain.  Genitourinary: Negative for dysuria and flank pain.  Musculoskeletal: Negative for back pain, neck pain and neck stiffness.  Skin: Negative for rash.  Neurological: Positive for seizures. Negative for light-headedness and headaches.      Allergies  Aspirin; Motrin; and Shellfish allergy  Home Medications   Prior to Admission medications   Medication Sig Start Date End Date Taking? Authorizing Provider  clonazePAM (KLONOPIN) 1 MG tablet Take 1 tablet (1 mg total) by mouth 2 (two) times daily as needed for anxiety. 01/09/12  Yes Cyril Mourning, MD  escitalopram (LEXAPRO) 20 MG tablet Take 20 mg by mouth daily.   Yes Historical Provider, MD  Multiple Vitamins-Calcium (ONE-A-DAY WOMENS PO) Take 1 tablet by mouth daily.   Yes Historical Provider, MD  SUMAtriptan (IMITREX) 100 MG tablet Take 1 tablet (100 mg total) by mouth once as needed for migraine. May repeat x 1 after 2 hours; maximum 2 tabs per day and 8 tabs per month 02/28/13  Yes Vikram R Penumalli, MD  topiramate (TOPAMAX) 50 MG tablet Take 1 tablet (50 mg total) by mouth 2 (two) times daily. 02/28/13  Yes Penni Bombard, MD  zolpidem (AMBIEN) 10 MG tablet Take 10 mg by mouth at bedtime as needed. For insomina   Yes Historical Provider, MD  topiramate (TOPAMAX) 25 MG tablet Take 1 tablet (25 mg total) by  mouth 2 (two) times daily. 07/08/13   Mariea Clonts, MD   BP 164/107  Pulse 99  Temp(Src) 98.2 F (36.8 C) (Oral)  Resp 20  Wt 151 lb 8 oz (68.72 kg)  SpO2 98%  LMP 06/06/2013 Physical Exam  Nursing note and vitals reviewed. Constitutional: She is oriented to person, place, and time. She appears well-developed and well-nourished.  HENT:  Head: Normocephalic and atraumatic.  Eyes: Conjunctivae are normal. Right eye exhibits no discharge. Left eye exhibits no discharge.  Neck: Normal range of  motion. Neck supple. No tracheal deviation present.  Cardiovascular: Normal rate and regular rhythm.   Pulmonary/Chest: Effort normal and breath sounds normal.  Abdominal: Soft. She exhibits no distension. There is no tenderness. There is no guarding.  Musculoskeletal: She exhibits no edema.  No midline vertebral tenderness full range of motion head and neck  Neurological: She is alert and oriented to person, place, and time.  5+ strength in UE and LE with f/e at major joints. Sensation to palpation intact in UE and LE. CNs 2-12 grossly intact.  EOMFI.  PERRL.   Finger nose and coordination intact bilateral.   Visual fields intact to finger testing.    Skin: Skin is warm. No rash noted.  Psychiatric: She has a normal mood and affect.    ED Course  Procedures (including critical care time) Labs Review Labs Reviewed  I-STAT CHEM 8, ED - Abnormal; Notable for the following:    Potassium 3.6 (*)    Glucose, Bld 100 (*)    All other components within normal limits  CBG MONITORING, ED  POC URINE PREG, ED    Imaging Review Ct Head Wo Contrast   (if New Onset Seizure And/or Head Trauma)  07/07/2013   CLINICAL DATA:  SEIZURES  EXAM: CT HEAD WITHOUT CONTRAST  TECHNIQUE: Contiguous axial images were obtained from the base of the skull through the vertex without intravenous contrast.  COMPARISON:  CT HEAD W/O CM dated 02/25/2013  FINDINGS: No acute intracranial abnormality. Specifically, no hemorrhage, hydrocephalus, mass lesion, acute infarction, or significant intracranial injury. No acute calvarial abnormality. Visualized paranasal sinuses and mastoid air cells are patent.  IMPRESSION: Normal head CT.   Electronically Signed   By: Margaree Mackintosh M.D.   On: 07/07/2013 23:37     EKG Interpretation None      MDM   Final diagnoses:  Seizures   Patient with witnessed seizure similar to previous. Patient initially denied taking any seizure medications and spoke with neurology and reviewed  medical records showing patient's was then Topamax 50 mg twice a day. I do not suspect alcohol withdrawal. Patient is well-appearing normal neuro exam and ED. Neurology Dr. Doy Mince recommends increased Topamax to 75 mg twice daily and close followup outpatient with general neurology. No seizures in ED. Results and differential diagnosis were discussed with the patient/parent/guardian. Close follow up outpatient was discussed, comfortable with the plan.   Filed Vitals:   07/08/13 0045 07/08/13 0115 07/08/13 0145 07/08/13 0201  BP: 142/97 142/97 137/99 143/94  Pulse: 72 74 68 80  Temp:    98.5 F (36.9 C)  TempSrc:    Oral  Resp: 18 14 20 18   Weight:      SpO2: 98% 99% 97% 98%         Mariea Clonts, MD 07/08/13 [phone]

## 2015-08-08 DIAGNOSIS — N762 Acute vulvitis: Secondary | ICD-10-CM | POA: Diagnosis not present

## 2015-08-08 DIAGNOSIS — B373 Candidiasis of vulva and vagina: Secondary | ICD-10-CM | POA: Diagnosis not present

## 2015-08-08 DIAGNOSIS — R300 Dysuria: Secondary | ICD-10-CM | POA: Diagnosis not present

## 2015-10-10 ENCOUNTER — Encounter (HOSPITAL_BASED_OUTPATIENT_CLINIC_OR_DEPARTMENT_OTHER): Payer: Self-pay | Admitting: Emergency Medicine

## 2015-10-10 ENCOUNTER — Emergency Department (HOSPITAL_BASED_OUTPATIENT_CLINIC_OR_DEPARTMENT_OTHER): Payer: BLUE CROSS/BLUE SHIELD

## 2015-10-10 ENCOUNTER — Emergency Department (HOSPITAL_BASED_OUTPATIENT_CLINIC_OR_DEPARTMENT_OTHER)
Admission: EM | Admit: 2015-10-10 | Discharge: 2015-10-10 | Disposition: A | Discharge status: Home / Self Care | Payer: BLUE CROSS/BLUE SHIELD | Attending: Emergency Medicine | Admitting: Emergency Medicine

## 2015-10-10 DIAGNOSIS — K219 Gastro-esophageal reflux disease without esophagitis: Secondary | ICD-10-CM | POA: Diagnosis not present

## 2015-10-10 DIAGNOSIS — R5383 Other fatigue: Secondary | ICD-10-CM | POA: Diagnosis not present

## 2015-10-10 DIAGNOSIS — I10 Essential (primary) hypertension: Secondary | ICD-10-CM | POA: Insufficient documentation

## 2015-10-10 DIAGNOSIS — R079 Chest pain, unspecified: Secondary | ICD-10-CM | POA: Diagnosis not present

## 2015-10-10 LAB — CBC WITH DIFFERENTIAL/PLATELET
BASOS ABS: 0 10*3/uL (ref 0.0–0.1)
Basophils Relative: 0 %
EOS PCT: 5 %
Eosinophils Absolute: 0.4 10*3/uL (ref 0.0–0.7)
HEMATOCRIT: 36.3 % (ref 36.0–46.0)
Hemoglobin: 12.1 g/dL (ref 12.0–15.0)
Lymphocytes Relative: 33 %
Lymphs Abs: 2.3 10*3/uL (ref 0.7–4.0)
MCH: 28.4 pg (ref 26.0–34.0)
MCHC: 33.3 g/dL (ref 30.0–36.0)
MCV: 85.2 fL (ref 78.0–100.0)
Monocytes Absolute: 0.4 10*3/uL (ref 0.1–1.0)
Monocytes Relative: 6 %
Neutro Abs: 3.9 10*3/uL (ref 1.7–7.7)
Neutrophils Relative %: 56 %
PLATELETS: 338 10*3/uL (ref 150–400)
RBC: 4.26 MIL/uL (ref 3.87–5.11)
RDW: 13.7 % (ref 11.5–15.5)
WBC: 6.9 10*3/uL (ref 4.0–10.5)

## 2015-10-10 LAB — BASIC METABOLIC PANEL
ANION GAP: 8 (ref 5–15)
BUN: 14 mg/dL (ref 6–20)
CO2: 23 mmol/L (ref 22–32)
Calcium: 9.8 mg/dL (ref 8.9–10.3)
Chloride: 103 mmol/L (ref 101–111)
Creatinine, Ser: 0.72 mg/dL (ref 0.44–1.00)
GFR calc Af Amer: >60 mL/min (ref >60)
Glucose, Bld: 101 mg/dL — ABNORMAL HIGH (ref 65–99)
POTASSIUM: 3.7 mmol/L (ref 3.5–5.1)
Sodium: 134 mmol/L — ABNORMAL LOW (ref 135–145)

## 2015-10-10 LAB — TROPONIN I: Troponin I: <0.03 ng/mL (ref <0.03)

## 2015-10-10 MED ORDER — GI COCKTAIL ~~LOC~~
30.0000 mL | Freq: Once | ORAL | Status: AC
  Administered 2015-10-10: 30 mL via ORAL
  Filled 2015-10-10: qty 30

## 2015-10-10 MED ORDER — OMEPRAZOLE 20 MG PO CPDR: 20.0000 mg | DELAYED_RELEASE_CAPSULE | Freq: Every day | ORAL | 0 refills | Status: DC

## 2015-10-10 MED ORDER — CLONIDINE HCL 0.1 MG PO TABS
0.1000 mg | ORAL_TABLET | Freq: Once | ORAL | Status: AC
  Administered 2015-10-10: 0.1 mg via ORAL
  Filled 2015-10-10: qty 1

## 2015-10-10 MED FILL — OMEPRAZOLE DR 20 MG CAPSULE: 20 | 30 days supply | Qty: 30 | Fill #0

## 2015-10-10 NOTE — ED Notes (Signed)
Marlon Pel, [place] notified of patient's vital signs

## 2015-10-10 NOTE — ED Notes (Signed)
Patient transported to X-ray 

## 2015-10-10 NOTE — ED Triage Notes (Signed)
Pt states that she has been having chest pain since Monday. Says that she is feeling extremely tired lately, with dizziness, lightheadedness, and sweating. Pt also states that she is having sob upon exertion. Pt has bruising on legs and abdomen that she doesn't know where they have come from.  Pt states that she has not fallen and does not have any issues with blood clotting.

## 2015-10-10 NOTE — ED Provider Notes (Signed)
[facility] Provider Note   CSN: [id number] Arrival date & time: 10/10/15  0907     History   Chief Complaint Chief Complaint  Patient presents with  . Chest Pain    HPI Samantha Monroe is a 40 y.o. female.  Patient presents to the ED with a chief complaint of burning in chest.  She states that it feels like acid reflux.  She states that she had these symptoms when she was pregnant.  She denies having any symptoms now, but states that her symptoms were moderate this morning when she awoke.  She has tried using TUMS with good relief.   Additionally, patient complains of bruising on her legs and abdomen. She is uncertain for the bruises are from. She denies any bleeding around the eyes, gums, or blood in stools.  She states that she has felt more fatigued recently. She reports some exertional shortness of breath, but attributes this to deconditioning and increasing age.      The history is provided by the patient. No language interpreter was used.    Past Medical History:  Diagnosis Date  . Anxiety   . Depression   . High cholesterol   . High triglycerides   . Seizures [facility])     Patient Active Problem List   Diagnosis Date Noted  . DYSLIPIDEMIA 04/05/2010  . OVERWEIGHT/OBESITY 04/05/2010    Past Surgical History:  Procedure Laterality Date  . APPENDECTOMY    . CESAREAN SECTION    . DILITATION & CURRETTAGE/HYSTROSCOPY WITH THERMACHOICE ABLATION  01/09/2012   Procedure: DILATATION & CURETTAGE/HYSTEROSCOPY WITH THERMACHOICE ABLATION;  Surgeon: Cyril Mourning, MD;  Location: [facility];  Service: Gynecology;  Laterality: N/A;  . TUBAL LIGATION      OB History    No data available       Home Medications    Prior to Admission medications   Medication Sig Start Date End Date Taking? Authorizing Provider  clonazePAM (KLONOPIN) 1 MG tablet Take 1 tablet (1 mg total) by mouth 2 (two) times daily as needed for anxiety. 01/09/12   Dian Queen, MD    escitalopram (LEXAPRO) 20 MG tablet Take 20 mg by mouth daily.    Historical Provider, MD  Multiple Vitamins-Calcium (ONE-A-DAY WOMENS PO) Take 1 tablet by mouth daily.    Historical Provider, MD  SUMAtriptan (IMITREX) 100 MG tablet Take 1 tablet (100 mg total) by mouth once as needed for migraine. May repeat x 1 after 2 hours; maximum 2 tabs per day and 8 tabs per month 02/28/13   Penni Bombard, MD  topiramate (TOPAMAX) 25 MG tablet Take 1 tablet (25 mg total) by mouth 2 (two) times daily. 07/08/13   Elnora Morrison, MD  topiramate (TOPAMAX) 50 MG tablet Take 1 tablet (50 mg total) by mouth 2 (two) times daily. 02/28/13   Penni Bombard, MD  zolpidem (AMBIEN) 10 MG tablet Take 10 mg by mouth at bedtime as needed. For insomina    Historical Provider, MD    Family History Family History  Problem Relation Age of Onset  . High Cholesterol    . Heart disease Maternal Aunt     Social History Social History  Substance Use Topics  . Smoking status: Never Smoker  . Smokeless tobacco: Never Used  . Alcohol use Yes     Comment: occasional/ 2 or 3 weekly     Allergies   Aspirin; Motrin [ibuprofen]; and Shellfish allergy   Review of Systems Review of  Systems  Constitutional: Positive for fatigue.  Cardiovascular: Positive for chest pain.  Skin: Positive for color change.  All other systems reviewed and are negative.    Physical Exam Updated Vital Signs BP (!) 182/125 (BP Location: Left Arm)   Pulse 102   Temp 97.8 F (36.6 C) (Oral)   Resp 18   Ht 5\' 2"  (1.575 m)   Wt 70.3 kg   SpO2 99%   BMI 28.35 kg/m   Physical Exam  Constitutional: She is oriented to person, place, and time. She appears well-developed and well-nourished.  HENT:  Head: Normocephalic and atraumatic.  Eyes: Conjunctivae and EOM are normal. Pupils are equal, round, and reactive to light.  Neck: Normal range of motion. Neck supple.  Cardiovascular: Normal rate and regular rhythm.  Exam reveals no  gallop and no friction rub.   No murmur heard. Pulmonary/Chest: Effort normal and breath sounds normal. No respiratory distress. She has no wheezes. She has no rales. She exhibits no tenderness.  CTAB  Abdominal: Soft. Bowel sounds are normal. She exhibits no distension and no mass. There is no tenderness. There is no rebound and no guarding.  Musculoskeletal: Normal range of motion. She exhibits no edema or tenderness.  Neurological: She is alert and oriented to person, place, and time.  Skin: Skin is warm and dry.  Scattered contusions on the lower extremities and on abdomen  Psychiatric: She has a normal mood and affect. Her behavior is normal. Judgment and thought content normal.  Nursing note and vitals reviewed.    ED Treatments / Results  Labs (all labs ordered are listed, but only abnormal results are displayed) Labs Reviewed  BASIC METABOLIC PANEL - Abnormal; Notable for the following:       Result Value   Sodium 134 (*)    Glucose, Bld 101 (*)    All other components within normal limits  CBC WITH DIFFERENTIAL/PLATELET  TROPONIN I    EKG  EKG Interpretation None       Radiology No results found.  Procedures Procedures (including critical care time)  Medications Ordered in ED Medications  gi cocktail (Maalox,Lidocaine,Donnatal) (not administered)     Initial Impression / Assessment and Plan / ED Course  I have reviewed the triage vital signs and the nursing notes.  Pertinent labs & imaging results that were available during my care of the patient were reviewed by me and considered in my medical decision making (see chart for details).  Clinical Course    Patient with burning sensation in her chest. States it feels like acid reflux. Will give GI cocktail. Patient is pain-free now. She does also have secondary complaint of contusions on her lower extremities and on abdomen. She does not know where they've come from. She denies any other bleeding. She  states that she has had some fatigue. Will check blood counts. Check platelet count. Will reassess. Cardiac risk factors are hypertension, hyperlipidemia, and family history. Her symptoms sound atypical.  EKG unremarkable for ischemic changes.  Troponin is negative.  Symptoms sound characteristic of GERD.  Will prescribe omeprazole.  She also has suboptimal BP control.  I have given her a dose of clonidine here and will recommend that she see her PCP for this.  She does not have a headache.  She is symptom free now.  Labs are reassuring.  No evidence of end organ damage.  Final Clinical Impressions(s) / ED Diagnoses   Final diagnoses:  Gastroesophageal reflux disease, esophagitis presence not specified  Essential hypertension    New Prescriptions New Prescriptions   OMEPRAZOLE (PRILOSEC) 20 MG CAPSULE    Take 1 capsule (20 mg total) by mouth daily.     Montine Circle, PA-C 10/10/15 1157    Julianne Rice, MD 10/10/15 [phone]

## 2015-10-12 ENCOUNTER — Ambulatory Visit (INDEPENDENT_AMBULATORY_CARE_PROVIDER_SITE_OTHER): Payer: BLUE CROSS/BLUE SHIELD | Admitting: Family Medicine

## 2015-10-12 ENCOUNTER — Encounter: Payer: Self-pay | Admitting: Family Medicine

## 2015-10-12 VITALS — BP 140/90 | HR 84 | Temp 98.4°F | Resp 18 | Ht 62.0 in | Wt 165.4 lb

## 2015-10-12 DIAGNOSIS — E784 Other hyperlipidemia: Secondary | ICD-10-CM

## 2015-10-12 DIAGNOSIS — R238 Other skin changes: Secondary | ICD-10-CM

## 2015-10-12 DIAGNOSIS — I10 Essential (primary) hypertension: Secondary | ICD-10-CM

## 2015-10-12 DIAGNOSIS — E669 Obesity, unspecified: Secondary | ICD-10-CM | POA: Insufficient documentation

## 2015-10-12 DIAGNOSIS — Z131 Encounter for screening for diabetes mellitus: Secondary | ICD-10-CM | POA: Diagnosis not present

## 2015-10-12 DIAGNOSIS — Z114 Encounter for screening for human immunodeficiency virus [HIV]: Secondary | ICD-10-CM

## 2015-10-12 DIAGNOSIS — E785 Hyperlipidemia, unspecified: Secondary | ICD-10-CM

## 2015-10-12 DIAGNOSIS — R233 Spontaneous ecchymoses: Secondary | ICD-10-CM

## 2015-10-12 DIAGNOSIS — Z23 Encounter for immunization: Secondary | ICD-10-CM

## 2015-10-12 DIAGNOSIS — R5383 Other fatigue: Secondary | ICD-10-CM

## 2015-10-12 HISTORY — DX: Obesity, unspecified: E66.9

## 2015-10-12 HISTORY — DX: Hyperlipidemia, unspecified: E78.5

## 2015-10-12 HISTORY — DX: Essential (primary) hypertension: I10

## 2015-10-12 LAB — TSH: TSH: 2.97 u[IU]/mL (ref 0.35–4.50)

## 2015-10-12 LAB — LIPID PANEL
CHOL/HDL RATIO: 6
CHOLESTEROL: 288 mg/dL — AB (ref 0–200)
HDL: 50 mg/dL (ref >39.00)
NonHDL: 238.22
TRIGLYCERIDES: 243 mg/dL — AB (ref 0.0–149.0)
VLDL: 48.6 mg/dL — ABNORMAL HIGH (ref 0.0–40.0)

## 2015-10-12 LAB — LDL CHOLESTEROL, DIRECT: LDL DIRECT: 197 mg/dL

## 2015-10-12 LAB — HEMOGLOBIN A1C: HEMOGLOBIN A1C: 5.6 % (ref 4.6–6.5)

## 2015-10-12 LAB — PROTIME-INR
INR: 1 ratio (ref 0.8–1.0)
Prothrombin Time: 10.6 s (ref 9.6–13.1)

## 2015-10-12 LAB — MICROALBUMIN / CREATININE URINE RATIO
CREATININE, U: 239.7 mg/dL
MICROALB/CREAT RATIO: 0.4 mg/g (ref 0.0–30.0)
Microalb, Ur: 1 mg/dL (ref 0.0–1.9)

## 2015-10-12 LAB — APTT: APTT: 27.3 s (ref 23.4–32.7)

## 2015-10-12 MED ORDER — AMLODIPINE BESYLATE 5 MG PO TABS: 5.0000 mg | ORAL_TABLET | Freq: Every day | ORAL | 3 refills | Status: DC

## 2015-10-12 NOTE — Progress Notes (Signed)
Pre visit review using our clinic review tool, if applicable. No additional management support is needed unless otherwise documented below in the visit note. 

## 2015-10-12 NOTE — Progress Notes (Signed)
Chief Complaint  Patient presents with  . Establish Care    Pt new to establish care. Feels  tired, easily bruised. Has hypertension and elevated cholesterol. Just started prilosec for GERD.        New Patient Visit SUBJECTIVE: HPI: Samantha Monroe is an 40 y.o.female who is being seen for establishing care.  The patient was previously seen at OB/GYN.  Health maintenance history: Pap/pelvic: <1 year ago, Mammogram: Set up through OB/GYN Routine blood work: Many years ago HIV screening: Never had Tetanus: Not sure, will get one today  Concerns: Fatigue and bruises easily. Fatigue started Monday and Tuesday. Has been improving. Barely able to keep eyes open during the day. Poor restfulness. She does snore and have a hx of depression. Unsure about weight changes. No change in sleeping habits or eating.  Bruising Notices that she has lots of bruises on her lower extremities. No significant trauma reported. She does not have a hx of easy bruising or bleeding, and denies a fam hx as well. She does not have any areas of easy bleeding, such as gum bleeding while brushing her teeth. No use of anticoagulants or antithrombotics.  Allergies  Allergen Reactions  . Aspirin Other (See Comments)    Eye redness, felt like throat was closing.  . Motrin [Ibuprofen]     Felt like throat was closing  . Shellfish Allergy Other (See Comments)    Felt like throat was closing   Past Medical History:  Diagnosis Date  . Anxiety   . Depression   . Dyslipidemia (high LDL; low HDL) 10/12/2015  . Essential hypertension 10/12/2015  . High cholesterol   . High triglycerides   . Hypertension   . Migraines   . Obesity 10/12/2015  . Seizures ([place])    Past Surgical History:  Procedure Laterality Date  . APPENDECTOMY    . CESAREAN SECTION    . DILITATION & CURRETTAGE/HYSTROSCOPY WITH THERMACHOICE ABLATION  01/09/2012   Procedure: DILATATION & CURETTAGE/HYSTEROSCOPY WITH THERMACHOICE ABLATION;  Surgeon:  Cyril Mourning, MD;  Location: [facility];  Service: Gynecology;  Laterality: N/A;  . TUBAL LIGATION     Social History   Social History  . Marital status: Divorced  . Number of children: 3  . Years of education: 12th   Occupational History  . 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 [employer]   Social History Main Topics  . Smoking status: Never Smoker  . Smokeless tobacco: Never Used  . Alcohol use Yes     Comment: occasional/ 2 or 3 weekly  . Drug use: No    Birth control/ protection: Other- tubal ligation   Social History Narrative   Patient lives at home with children.   Caffeine Use: 1 cup of coffee daily   Family History  Problem Relation Age of Onset  . Hypertension Mother   . Diabetes Mother     type II  . High Cholesterol    . Hyperlipidemia Brother   . Hyperlipidemia Maternal Grandmother   . Hyperlipidemia Maternal Grandfather     Current Outpatient Prescriptions:  .  escitalopram (LEXAPRO) 10 MG tablet, Take 10 mg by mouth daily., Disp: , Rfl:  .  HYDROcodone-acetaminophen (NORCO) 10-325 MG tablet, Take 1 tablet by mouth daily as needed for migraine or cramping., Disp: , Rfl:  .  omeprazole (PRILOSEC) 20 MG capsule, Take 1 capsule (20 mg total) by mouth daily., Disp: 30 capsule, Rfl: 0 .  triamterene-hydrochlorothiazide (MAXZIDE) 75-50 MG tablet, Take 1 tablet by  mouth daily., Disp: , Rfl:  .  zolpidem (AMBIEN) 10 MG tablet, Take 10 mg by mouth at bedtime as needed. For insomina, Disp: , Rfl:  .  amLODipine (NORVASC) 5 MG tablet, Take 1 tablet (5 mg total) by mouth daily., Disp: 90 tablet, Rfl: 3  Patient's last menstrual period was 10/06/2015.  ROS Consitutional: Denies fevers, chills, +fatigue  Heme: +easy bruising   OBJECTIVE: BP 140/90 (BP Location: Left Arm, Cuff Size: Normal)   Pulse 84   Temp 98.4 F (36.9 C) (Oral)   Resp 18   Ht 5\' 2"  (1.575 m)   Wt 165 lb 6.4 oz (75 kg)   LMP 10/06/2015   SpO2 97% Comment: room air  BMI 30.25 kg/m   Constitutional:  -  VS reviewed -  Well developed, well nourished, appears stated age -  No apparent distress  Psychiatric: -  Oriented to person, place, and time -  Memory intact -  Affect and mood normal -  Fluent conversation, good eye contact -  Judgment and insight age appropriate  ENMT: -  Ears are patent b/l without erythema or discharge. TM's are shiny and clear b/l without evidence of effusion or infection. -  Oral mucosa without lesions, ecchymosis, tongue and uvula midline    Tonsils not enlarged, no erythema, no exudate, trachea midline    Pharynx moist, no lesions, no erythema  Neck: -  No gross swelling, no palpable masses -  Thyroid midline, not enlarged, mobile, no palpable masses  Cardiovascular: -  RRR, no murmurs -  No LE edema  Respiratory: -  Normal respiratory effort, no accessory muscle use, no retraction -  Breath sounds equal, no wheezes, no ronchi, no crackles  Musculoskeletal: -  No clubbing, no cyanosis -  Gait normal  Skin: -  Some bruising noted on the lower extremities. AVM on the medial L arm.  -  Warm and dry to palpation   ASSESSMENT/PLAN: Other fatigue - Plan: TSH  Easy bruising - Plan: PTT, INR/PT, Thrombin time, CANCELED: Antithrombin III  Obesity  Dyslipidemia (high LDL; low HDL) - Plan: Lipid panel  Essential hypertension - Plan: Microalbumin / creatinine urine ratio, amLODipine (NORVASC) 5 MG tablet  Screening for diabetes mellitus (DM) - Plan: Hemoglobin A1c  Screening for HIV (human immunodeficiency virus) - Plan: HIV antibody  Orders as above. I will add Norvasc to her regimen.  As her fatigue has only been going on for a few days, will get some labs and follow up as needed. Discussed OSA and depression as being very common causes of fatigue.  For her bruising, her platelets are normal. No other signs of bleeding. Will obtain some coag studies and if normal, she may be an easy bruiser.  She has been on Ambien for 10 years. It will be difficult to  get her off of this, but will save this discussion for the future. She receives Norco from her OB/GYN for intermittent menstrual pains. Patient should return in 2-4 weeks to recheck BP and review labs. The patient voiced understanding and agreement to the plan.   Crosby Oyster [place]

## 2015-10-12 NOTE — Patient Instructions (Addendum)
Take your new blood pressure medication daily.   Things to remember for fatigue: sleep apnea (where you stop breathing at night) and depression. If your mood is affecting your way of life, come in for a visit.   Routine exercise can help with fatigue as well.

## 2015-10-13 LAB — HIV ANTIBODY (ROUTINE TESTING W REFLEX): HIV 1&2 Ab, 4th Generation: NONREACTIVE

## 2015-10-17 LAB — THROMBIN CLOTTING TIME: Thrombin Clotting Time: 16 s (ref 13–19)

## 2015-10-18 ENCOUNTER — Telehealth: Payer: Self-pay | Admitting: *Deleted

## 2015-10-18 NOTE — Telephone Encounter (Signed)
Pt called regarding recent lab results.  Spoke with the pt and informed her of recent lab results and note.  Pt verbalized understanding and agreed.//AB/CMA

## 2015-10-26 ENCOUNTER — Encounter: Payer: Self-pay | Admitting: Family Medicine

## 2015-10-26 ENCOUNTER — Ambulatory Visit (INDEPENDENT_AMBULATORY_CARE_PROVIDER_SITE_OTHER): Payer: BLUE CROSS/BLUE SHIELD | Admitting: Family Medicine

## 2015-10-26 VITALS — BP 128/90 | HR 100 | Temp 98.9°F | Ht 62.0 in | Wt 163.6 lb

## 2015-10-26 DIAGNOSIS — R238 Other skin changes: Secondary | ICD-10-CM

## 2015-10-26 DIAGNOSIS — I10 Essential (primary) hypertension: Secondary | ICD-10-CM | POA: Diagnosis not present

## 2015-10-26 DIAGNOSIS — F418 Other specified anxiety disorders: Secondary | ICD-10-CM

## 2015-10-26 DIAGNOSIS — R233 Spontaneous ecchymoses: Secondary | ICD-10-CM

## 2015-10-26 HISTORY — DX: Other skin changes: R23.8

## 2015-10-26 HISTORY — DX: Spontaneous ecchymoses: R23.3

## 2015-10-26 MED ORDER — SERTRALINE HCL 50 MG PO TABS: 50.0000 mg | ORAL_TABLET | Freq: Every day | ORAL | 0 refills | Status: DC

## 2015-10-26 NOTE — Progress Notes (Signed)
Pre visit review using our clinic review tool, if applicable. No additional management support is needed unless otherwise documented below in the visit note. 

## 2015-10-26 NOTE — Patient Instructions (Addendum)
DASH Eating Plan  DASH stands for "Dietary Approaches to Stop Hypertension." The DASH eating plan is a healthy eating plan that has been shown to reduce high blood pressure (hypertension). Additional health benefits may include reducing the risk of type 2 diabetes mellitus, heart disease, and stroke. The DASH eating plan may also help with weight loss.  WHAT DO I NEED TO KNOW ABOUT THE DASH EATING PLAN?  For the DASH eating plan, you will follow these general guidelines:  · Choose foods with a percent daily value for sodium of less than 5% (as listed on the food label).  · Use salt-free seasonings or herbs instead of table salt or sea salt.  · Check with your health care provider or pharmacist before using salt substitutes.  · Eat lower-sodium products, often labeled as "lower sodium" or "no salt added."  · Eat fresh foods.  · Eat more vegetables, fruits, and low-fat dairy products.  · Choose whole grains. Look for the word "whole" as the first word in the ingredient list.  · Choose fish and skinless chicken or turkey more often than red meat. Limit fish, poultry, and meat to 6 oz (170 g) each day.  · Limit sweets, desserts, sugars, and sugary drinks.  · Choose heart-healthy fats.  · Limit cheese to 1 oz (28 g) per day.  · Eat more home-cooked food and less restaurant, buffet, and fast food.  · Limit fried foods.  · Cook foods using methods other than frying.  · Limit canned vegetables. If you do use them, rinse them well to decrease the sodium.  · When eating at a restaurant, ask that your food be prepared with less salt, or no salt if possible.  WHAT FOODS CAN I EAT?  Seek help from a dietitian for individual calorie needs.  Grains  Whole grain or whole wheat bread. Brown rice. Whole grain or whole wheat pasta. Quinoa, bulgur, and whole grain cereals. Low-sodium cereals. Corn or whole wheat flour tortillas. Whole grain cornbread. Whole grain crackers. Low-sodium crackers.  Vegetables  Fresh or frozen vegetables  (raw, steamed, roasted, or grilled). Low-sodium or reduced-sodium tomato and vegetable juices. Low-sodium or reduced-sodium tomato sauce and paste. Low-sodium or reduced-sodium canned vegetables.   Fruits  All fresh, canned (in natural juice), or frozen fruits.  Meat and Other Protein Products  Ground beef (85% or leaner), grass-fed beef, or beef trimmed of fat. Skinless chicken or turkey. Ground chicken or turkey. Pork trimmed of fat. All fish and seafood. Eggs. Dried beans, peas, or lentils. Unsalted nuts and seeds. Unsalted canned beans.  Dairy  Low-fat dairy products, such as skim or 1% milk, 2% or reduced-fat cheeses, low-fat ricotta or cottage cheese, or plain low-fat yogurt. Low-sodium or reduced-sodium cheeses.  Fats and Oils  Tub margarines without trans fats. Light or reduced-fat mayonnaise and salad dressings (reduced sodium). Avocado. Safflower, olive, or canola oils. Natural peanut or almond butter.  Other  Unsalted popcorn and pretzels.  The items listed above may not be a complete list of recommended foods or beverages. Contact your dietitian for more options.  WHAT FOODS ARE NOT RECOMMENDED?  Grains  White bread. White pasta. White rice. Refined cornbread. Bagels and croissants. Crackers that contain trans fat.  Vegetables  Creamed or fried vegetables. Vegetables in a cheese sauce. Regular canned vegetables. Regular canned tomato sauce and paste. Regular tomato and vegetable juices.  Fruits  Dried fruits. Canned fruit in light or heavy syrup. Fruit juice.  Meat and Other Protein   Products  Fatty cuts of meat. Ribs, chicken wings, bacon, sausage, bologna, salami, chitterlings, fatback, hot dogs, bratwurst, and packaged luncheon meats. Salted nuts and seeds. Canned beans with salt.  Dairy  Whole or 2% milk, cream, half-and-half, and cream cheese. Whole-fat or sweetened yogurt. Full-fat cheeses or blue cheese. Nondairy creamers and whipped toppings. Processed cheese, cheese spreads, or cheese  curds.  Condiments  Onion and garlic salt, seasoned salt, table salt, and sea salt. Canned and packaged gravies. Worcestershire sauce. Tartar sauce. Barbecue sauce. Teriyaki sauce. Soy sauce, including reduced sodium. Steak sauce. Fish sauce. Oyster sauce. Cocktail sauce. Horseradish. Ketchup and mustard. Meat flavorings and tenderizers. Bouillon cubes. Hot sauce. Tabasco sauce. Marinades. Taco seasonings. Relishes.  Fats and Oils  Butter, stick margarine, lard, shortening, ghee, and bacon fat. Coconut, palm kernel, or palm oils. Regular salad dressings.  Other  Pickles and olives. Salted popcorn and pretzels.  The items listed above may not be a complete list of foods and beverages to avoid. Contact your dietitian for more information.  WHERE CAN I FIND MORE INFORMATION?  National [employer], [employer], [employer]: www.nhlbi.nih.gov/health/health-topics/topics/dash/     This information is not intended to replace advice given to you by your health care provider. Make sure you discuss any questions you have with your health care provider.     Document Released: 01/23/2011 Document Revised: 02/24/2014 Document Reviewed: 12/08/2012  Elsevier Interactive Patient Education ©2016 [employer]

## 2015-10-26 NOTE — Progress Notes (Signed)
Chief Complaint  Patient presents with  . Follow-up    on BP    Subjective Samantha Monroe is a 40 y.o. female who presents for hypertension follow up. She has not taken her medications yet today. She does monitor home blood pressures. 130/90's She is compliant with medications- Maxzide 75-50 mg daily and recently started on Norvasc 5 mg daily. Patient has these side effects of medication: none She is not adhering to a low sodium and low fat diet. Current exercise: Walking   Had episodes of easy bruising at last visit. All labs unremarkable. She does take a SSRI. She denies using garlic, Vit E, fish oil, or ginkgo. She is not bleeding anywhere and again, has no personal or family hx of bleeding disorders.  She has been on Lexapro for many years and does not remember being on anything else. She states that it helps a little, but not as much as it used to. She would like to try something else. More anxiety symptoms at this time. No thoughts of harming self or others. No self medications. She is not seeing a Social worker.  Past Medical History:  Diagnosis Date  . Anxiety   . Depression   . Dyslipidemia (high LDL; low HDL) 10/12/2015  . Easy bruising 10/26/2015  . Essential hypertension 10/12/2015  . High cholesterol   . High triglycerides   . Hypertension   . Migraines   . Obesity 10/12/2015  . Seizures ([place])    Family History  Problem Relation Age of Onset  . Hypertension Mother   . Diabetes Mother     type II  . High Cholesterol    . Hyperlipidemia Brother   . Hyperlipidemia Maternal Grandmother   . Hyperlipidemia Maternal Grandfather     Medications Current Outpatient Prescriptions on File Prior to Visit  Medication Sig Dispense Refill  . amLODipine (NORVASC) 5 MG tablet Take 1 tablet (5 mg total) by mouth daily. 90 tablet 3  . escitalopram (LEXAPRO) 10 MG tablet Take 10 mg by mouth daily.    Marland Kitchen HYDROcodone-acetaminophen (NORCO) 10-325 MG tablet Take 1 tablet by mouth daily as  needed for migraine or cramping.    Marland Kitchen omeprazole (PRILOSEC) 20 MG capsule Take 1 capsule (20 mg total) by mouth daily. 30 capsule 0  . triamterene-hydrochlorothiazide (MAXZIDE) 75-50 MG tablet Take 1 tablet by mouth daily.    Marland Kitchen zolpidem (AMBIEN) 10 MG tablet Take 10 mg by mouth at bedtime as needed. For insomina     Allergies Allergies  Allergen Reactions  . Aspirin Other (See Comments)    Eye redness, felt like throat was closing.  . Motrin [Ibuprofen]     Felt like throat was closing  . Shellfish Allergy Other (See Comments)    Felt like throat was closing    Review of Systems Eye:  no recent significant change in vision Cardiovascular:  no exercise intolerance, no chest pain, no palpitations Respiratory:  no cough or shortness of breath Neurologic:  no chronic headaches, numbness or tingling Heme: +easy bruising, denies areas of bleeding Psych: As noted in HPI  Exam BP 128/90 (BP Location: Left Arm, Patient Position: Sitting, Cuff Size: Normal) Comment: Pt has not had BP medication this am  Pulse 100   Temp 98.9 F (37.2 C) (Oral)   Ht 5\' 2"  (1.575 m)   Wt 163 lb 9.6 oz (74.2 kg)   LMP 10/06/2015   SpO2 97%   BMI 29.92 kg/m  General:  well developed, well nourished,  in no apparent distress Skin:  warm, no pallor or diaphoresis Eyes:  pupils equal and round, sclera anicteric without injection Neck: neck supple without adenopathy, thyromegaly, masses, or bruits  Lungs:  clear to auscultation, breath sounds equal bilaterally Cardio:  regular rate and rhythm without murmurs, heart sounds without clicks or rubs, no bruits or LE edema MSK:  no clubbing, cyanosis, or edema, no deformities, no skin discoloration, normal gait Psych: well oriented with normal range of affect and appropriate judgment/insight  Essential hypertension  Easy bruising  Depression with anxiety - Plan: sertraline (ZOLOFT) 50 MG tablet  Orders as above. Keep medication regimen the same. She is  going to make lifestyle changes and keep a BP log to bring to next appt. DASH diet info given. Change Lexapro to Zoloft. I encouraged her to see a counselor for better relief, but she is reluctant at this time. Could institute this or increase dose of Zoloft at next visit if not improving.  F/u in 6 weeks. The patient voiced understanding and agreement to the plan.  [place], DO 10/26/15  7:26 AM

## 2015-10-31 ENCOUNTER — Ambulatory Visit: Payer: Managed Care, Other (non HMO) | Admitting: Family Medicine

## 2015-12-07 ENCOUNTER — Ambulatory Visit: Payer: BLUE CROSS/BLUE SHIELD | Admitting: Family Medicine

## 2016-01-26 ENCOUNTER — Other Ambulatory Visit: Payer: Self-pay | Admitting: Family Medicine

## 2016-01-26 DIAGNOSIS — F418 Other specified anxiety disorders: Secondary | ICD-10-CM

## 2016-01-28 NOTE — Telephone Encounter (Signed)
I have refilled Rx for Sertraline #90 tablets 0 refills. TL/CMA

## 2016-02-29 ENCOUNTER — Ambulatory Visit (INDEPENDENT_AMBULATORY_CARE_PROVIDER_SITE_OTHER): Payer: BLUE CROSS/BLUE SHIELD | Admitting: Family Medicine

## 2016-02-29 ENCOUNTER — Other Ambulatory Visit: Payer: Self-pay | Admitting: Family Medicine

## 2016-02-29 VITALS — BP 140/94 | HR 113 | Temp 98.1°F | Ht 62.0 in | Wt 169.2 lb

## 2016-02-29 DIAGNOSIS — I10 Essential (primary) hypertension: Secondary | ICD-10-CM

## 2016-02-29 DIAGNOSIS — K219 Gastro-esophageal reflux disease without esophagitis: Secondary | ICD-10-CM

## 2016-02-29 DIAGNOSIS — J0100 Acute maxillary sinusitis, unspecified: Secondary | ICD-10-CM

## 2016-02-29 MED ORDER — OMEPRAZOLE 20 MG PO CPDR: 20.0000 mg | DELAYED_RELEASE_CAPSULE | Freq: Every day | ORAL | 3 refills | Status: DC

## 2016-02-29 MED ORDER — FLUTICASONE PROPIONATE 50 MCG/ACT NA SUSP: 2.0000 | Freq: Every day | NASAL | 2 refills | Status: DC

## 2016-02-29 NOTE — Patient Instructions (Addendum)
If symptoms worsen by day 10 or fail to improve by day 14, let me know. That would be an indication for antibiotic need.  Push fluids, practice good hand hygiene, cover mouth when coughing.  Try to stay active and eat a clean diet!  Claritin (loratadine), Allegra (fexofenadine), Zyrtec (cetirizine); these are listed in order from weakest to strongest. Generic, and therefore cheaper, options are in the parentheses.   There are available OTC, and the generic versions, which may be cheaper, are in parentheses. Show this to a pharmacist if you have trouble finding any of these items.

## 2016-02-29 NOTE — Progress Notes (Signed)
Pre visit review using our clinic review tool, if applicable. No additional management support is needed unless otherwise documented below in the visit note. 

## 2016-02-29 NOTE — Progress Notes (Signed)
Chief Complaint  Patient presents with  . Cough    c/o prod cough with yellow mucus that started 02/08/16. Pt now feels like she may have a head cold that started this Tuesday. Pt states that her ears feel stopped up. Pt has tried several otc meds with no relief.  Will need refill on Omeprazole.     Joannie D Widdowson here for URI complaints.  Duration: 4 days  Associated symptoms: sinus congestion, sinus pressure, rhinorrhea, ear fullness and resolving cough Denies: subjective fever, itchy watery eyes, ear pain, ear drainage, sore throat, shortness of breath and myalgia Treatment to date: Robitussin, Dayquil, Nyquil, Tylenol cold flu Sick contacts: No  ROS:  Const: Denies fevers HEENT: As noted in HPI Lungs: No SOB  Past Medical History:  Diagnosis Date  . Anxiety   . Depression   . Dyslipidemia (high LDL; low HDL) 10/12/2015  . Easy bruising 10/26/2015  . Essential hypertension 10/12/2015  . High cholesterol   . High triglycerides   . Hypertension   . Migraines   . Obesity 10/12/2015  . Seizures ([place])    Family History  Problem Relation Age of Onset  . Hypertension Mother   . Diabetes Mother     type II  . High Cholesterol    . Hyperlipidemia Brother   . Hyperlipidemia Maternal Grandmother   . Hyperlipidemia Maternal Grandfather     BP (!) 140/94   Pulse (!) 113   Temp 98.1 F (36.7 C) (Oral)   Ht 5\' 2"  (1.575 m)   Wt 169 lb 3.2 oz (76.7 kg)   LMP 02/15/2016   SpO2 98%   BMI 30.95 kg/m  General: Awake, alert, appears stated age 41: AT, [place], ears patent b/l and TM's neg, nares patent w/o discharge, pharynx pink and without exudates, MMM Neck: No masses or asymmetry Heart: RRR, no murmurs, no bruits Lungs: CTAB, no accessory muscle use Psych: Age appropriate judgment and insight, normal mood and affect  Acute non-recurrent maxillary sinusitis - Plan: fluticasone (FLONASE) 50 MCG/ACT nasal spray  Essential hypertension  Gastroesophageal reflux disease,  esophagitis presence not specified - Plan: omeprazole (PRILOSEC) 20 MG capsule  Orders as above. Also recommended PO antihistamine for symptom control. Discussed how a vast majority of these infections are viral in nature. Continue to push fluids, practice good hand hygiene, cover mouth when coughing. Also counseled on diet and exercise. BP elevated today and her home readings are similar.  F/u in 1 week if symptoms worsen or fail to improve. Fu in 2 mo for a BP visit. Could go up on Norvasc or add BB vs spironolactone. Will ensure she does not have OSA. Pt voiced understanding and agreement to the plan.  [place], DO 02/29/16 2:49 PM

## 2016-03-31 DIAGNOSIS — Z01419 Encounter for gynecological examination (general) (routine) without abnormal findings: Secondary | ICD-10-CM | POA: Diagnosis not present

## 2016-03-31 DIAGNOSIS — Z1212 Encounter for screening for malignant neoplasm of rectum: Secondary | ICD-10-CM | POA: Diagnosis not present

## 2016-03-31 DIAGNOSIS — Z1231 Encounter for screening mammogram for malignant neoplasm of breast: Secondary | ICD-10-CM | POA: Diagnosis not present

## 2016-04-17 ENCOUNTER — Encounter: Payer: Self-pay | Admitting: Family Medicine

## 2016-04-17 ENCOUNTER — Ambulatory Visit (INDEPENDENT_AMBULATORY_CARE_PROVIDER_SITE_OTHER): Payer: BLUE CROSS/BLUE SHIELD | Admitting: Family Medicine

## 2016-04-17 VITALS — BP 142/93 | HR 91 | Temp 98.2°F | Ht 62.0 in | Wt 164.6 lb

## 2016-04-17 DIAGNOSIS — J029 Acute pharyngitis, unspecified: Secondary | ICD-10-CM

## 2016-04-17 DIAGNOSIS — M545 Low back pain, unspecified: Secondary | ICD-10-CM

## 2016-04-17 DIAGNOSIS — I10 Essential (primary) hypertension: Secondary | ICD-10-CM

## 2016-04-17 LAB — POCT RAPID STREP A (OFFICE): RAPID STREP A SCREEN: NEGATIVE

## 2016-04-17 MED ORDER — DEXAMETHASONE 2 MG PO TABS: 10.0000 mg | ORAL_TABLET | Freq: Once | ORAL | 0 refills | Status: AC

## 2016-04-17 NOTE — Progress Notes (Signed)
SUBJECTIVE:   Samantha Monroe is a 41 y.o. female presents to the clinic for:  Chief Complaint  Patient presents with  . Sore Throat    Complains of sore throat for 3 days.  Other associated symptoms: sore throat.  Denies: sinus congestion, sinus pain, rhinorrhea, shortness of breath and myalgia Sick Contacts: none known Therapy to date: Dayquil and Nyquil, Tylenol  Hypertension Patient presents for hypertension follow up. She does not monitor home blood pressures. She is compliant with medications- Norvasc 5 mg daily, Maxide 37.5-25 mg daily. Patient has these side effects of medication: none She is not adhering to a healthy diet overall.  LBP Tweaked back over weekend while moving. She is steadily improving and has been using Tylenol. No red flag signs/symptoms. She does not exercise routinely or do any stretches.  History  Smoking Status  . Never Smoker  Smokeless Tobacco  . Never Used    ROS: Pertinent items are noted in HPI  Patient's medications, allergies, past medical, surgical, social and family histories were reviewed and updated as appropriate.  OBJECTIVE:  BP (!) 142/93 (BP Location: Left Arm, Patient Position: Sitting, Cuff Size: Large)   Pulse 91   Temp 98.2 F (36.8 C) (Oral)   Ht 5\' 2"  (1.575 m)   Wt 164 lb 9.6 oz (74.7 kg)   SpO2 97% Comment: RA  BMI 30.11 kg/m  General: Awake, alert, appearing stated age Eyes: conjunctivae and sclerae clear Ears: normal TMs bilaterally Nose: no visible exudate Oropharynx: lips, mucosa, and tongue normal; teeth and gums normal, pharynx and tonsils are unremarkable.  Neck: supple, no significant adenopathy Lungs: clear to auscultation, no wheezes, rales or rhonchi, symmetric air entry, normal effort Heart: rate and rhythm regular Skin:reveals no rash Psych: Age appropriate judgment and insight  ASSESSMENT/PLAN:  Sore throat - Plan: POCT rapid strep A, dexamethasone (DECADRON) 2 MG tablet  Essential  hypertension  Acute bilateral low back pain without sciatica  Orders as above. 10 mg Decadron x1.   Continue to practice good hand hygiene and push fluids. Acetaminophen for pain. She had throat swelling with NSAIDs.  Encouraged her to start keeping home blood pressures. She has not been compliant with this. I will see her in 4 weeks to review her blood pressure log. Gave home stretches and exercises for low back pain. She is improving, continue Tylenol as needed. The Decadron may also help. Heat could be useful. Pt voiced understanding and agreement to the plan.  Vivian, DO 04/17/16 8:53 AM

## 2016-04-17 NOTE — Progress Notes (Signed)
Pre visit review using our clinic review tool, if applicable. No additional management support is needed unless otherwise documented below in the visit note. 

## 2016-04-17 NOTE — Patient Instructions (Signed)
Continue to push fluids, practice good hand hygiene, and cover your mouth if you cough.  If you start having fevers, shaking or shortness of breath, seek immediate care.  Around 3 times per week, check your blood pressure 4 times per day. Twice in the morning and twice in the evening. The readings should be at least one minute apart. Write down these values and bring them to your next nurse visit/appointment.  When you check your BP, make sure you have been doing something calm/relaxing 5 minutes prior to checking. Both feet should be flat on the floor and you should be sitting. Use your left arm and make sure it is in a relaxed position (on a table), and that the cuff is at the approximate level/height of your heart.  EXERCISES  RANGE OF MOTION (ROM) AND STRETCHING EXERCISES - Low Back Sprain Most people with lower back pain will find that their symptoms get worse with excessive bending forward (flexion) or arching at the lower back (extension). The exercises that will help resolve your symptoms will focus on the opposite motion.  Your physician, physical therapist or athletic trainer will help you determine which exercises will be most helpful to resolve your lower back pain. Do not complete any exercises without first consulting with your caregiver. Discontinue any exercises which make your symptoms worse, until you speak to your caregiver. If you have pain, numbness or tingling which travels down into your buttocks, leg or foot, the goal of the therapy is for these symptoms to move closer to your back and eventually resolve. Sometimes, these leg symptoms will get better, but your lower back pain may worsen. This is often an indication of progress in your rehabilitation. Be very alert to any changes in your symptoms and the activities in which you participated in the 24 hours prior to the change. Sharing this information with your caregiver will allow him or her to most efficiently treat your  condition. These exercises may help you when beginning to rehabilitate your injury. Your symptoms may resolve with or without further involvement from your physician, physical therapist or athletic trainer. While completing these exercises, remember:   Restoring tissue flexibility helps normal motion to return to the joints. This allows healthier, less painful movement and activity.  An effective stretch should be held for at least 30 seconds.  A stretch should never be painful. You should only feel a gentle lengthening or release in the stretched tissue. FLEXION RANGE OF MOTION AND STRETCHING EXERCISES:  STRETCH - Flexion, Single Knee to Chest   Lie on a firm bed or floor with both legs extended in front of you.  Keeping one leg in contact with the floor, bring your opposite knee to your chest. Hold your leg in place by either grabbing behind your thigh or at your knee.  Pull until you feel a gentle stretch in your low back. Hold 15-20 seconds.  Slowly release your grasp and repeat the exercise with the opposite side. Repeat 2 times. Complete this exercise 1-2 times per day.   STRETCH - Flexion, Double Knee to Chest  Lie on a firm bed or floor with both legs extended in front of you.  Keeping one leg in contact with the floor, bring your opposite knee to your chest.  Tense your stomach muscles to support your back and then lift your other knee to your chest. Hold your legs in place by either grabbing behind your thighs or at your knees.  Pull both knees  toward your chest until you feel a gentle stretch in your low back. Hold 15-20 seconds.  Tense your stomach muscles and slowly return one leg at a time to the floor. Repeat 2 times. Complete this exercise 1-2 times per day.   STRETCH - Low Trunk Rotation  Lie on a firm bed or floor. Keeping your legs in front of you, bend your knees so they are both pointed toward the ceiling and your feet are flat on the floor.  Extend your  arms out to the side. This will stabilize your upper body by keeping your shoulders in contact with the floor.  Gently and slowly drop both knees together to one side until you feel a gentle stretch in your low back. Hold for 15-20 seconds.  Tense your stomach muscles to support your lower back as you bring your knees back to the starting position. Repeat the exercise to the other side. Repeat 2 times. Complete this exercise 1-2 times per day  EXTENSION RANGE OF MOTION AND FLEXIBILITY EXERCISES:  STRETCH - Extension, Prone on Elbows   Lie on your stomach on the floor, a bed will be too soft. Place your palms about shoulder width apart and at the height of your head.  Place your elbows under your shoulders. If this is too painful, stack pillows under your chest.  Allow your body to relax so that your hips drop lower and make contact more completely with the floor.  Hold this position for 15-20 seconds.  Slowly return to lying flat on the floor. Repeat 2 times. Complete this exercise 1-2 times per day.   RANGE OF MOTION - Extension, Prone Press Ups  Lie on your stomach on the floor, a bed will be too soft. Place your palms about shoulder width apart and at the height of your head.  Keeping your back as relaxed as possible, slowly straighten your elbows while keeping your hips on the floor. You may adjust the placement of your hands to maximize your comfort. As you gain motion, your hands will come more underneath your shoulders.  Hold this position 15-20 seconds.  Slowly return to lying flat on the floor. Repeat 2 times. Complete this exercise 1-2 times per day.   RANGE OF MOTION- Quadruped, Neutral Spine   Assume a hands and knees position on a firm surface. Keep your hands under your shoulders and your knees under your hips. You may place padding under your knees for comfort.  Drop your head and point your tailbone toward the ground below you. This will round out your lower back  like an angry cat. Hold this position for 15-20 seconds.  Slowly lift your head and release your tail bone so that your back sags into a large arch, like an old horse.  Hold this position for 15-20 seconds.  Repeat this until you feel limber in your low back.  Now, find your "sweet spot." This will be the most comfortable position somewhere between the two previous positions. This is your neutral spine. Once you have found this position, tense your stomach muscles to support your low back.  Hold this position for 15-20 seconds. Repeat 2 times. Complete this exercise 1-2 times per day.  STRENGTHENING EXERCISES - Low Back Sprain These exercises may help you when beginning to rehabilitate your injury. These exercises should be done near your "sweet spot." This is the neutral, low-back arch, somewhere between fully rounded and fully arched, that is your least painful position. When performed in  this safe range of motion, these exercises can be used for people who have either a flexion or extension based injury. These exercises may resolve your symptoms with or without further involvement from your physician, physical therapist or athletic trainer. While completing these exercises, remember:   Muscles can gain both the endurance and the strength needed for everyday activities through controlled exercises.  Complete these exercises as instructed by your physician, physical therapist or athletic trainer. Increase the resistance and repetitions only as guided.  You may experience muscle soreness or fatigue, but the pain or discomfort you are trying to eliminate should never worsen during these exercises. If this pain does worsen, stop and make certain you are following the directions exactly. If the pain is still present after adjustments, discontinue the exercise until you can discuss the trouble with your caregiver.  STRENGTHENING - Deep Abdominals, Pelvic Tilt   Lie on a firm bed or floor. Keeping  your legs in front of you, bend your knees so they are both pointed toward the ceiling and your feet are flat on the floor.  Tense your lower abdominal muscles to press your low back into the floor. This motion will rotate your pelvis so that your tail bone is scooping upwards rather than pointing at your feet or into the floor. With a gentle tension and even breathing, hold this position for 10-15 seconds. Repeat 2 times. Complete this exercise 1 time per day.   STRENGTHENING - Abdominals, Crunches   Lie on a firm bed or floor. Keeping your legs in front of you, bend your knees so they are both pointed toward the ceiling and your feet are flat on the floor. Cross your arms over your chest.  Slightly tip your chin down without bending your neck.  Tense your abdominals and slowly lift your trunk high enough to just clear your shoulder blades. Lifting higher can put excessive stress on the lower back and does not further strengthen your abdominal muscles.  Control your return to the starting position. Repeat 2 times. Complete this exercise once every 1-2 days.   STRENGTHENING - Quadruped, Opposite UE/LE Lift   Assume a hands and knees position on a firm surface. Keep your hands under your shoulders and your knees under your hips. You may place padding under your knees for comfort.  Find your neutral spine and gently tense your abdominal muscles so that you can maintain this position. Your shoulders and hips should form a rectangle that is parallel with the floor and is not twisted.  Keeping your trunk steady, lift your right hand no higher than your shoulder and then your left leg no higher than your hip. Make sure you are not holding your breath. Hold this position for 15-20 seconds.  Continuing to keep your abdominal muscles tense and your back steady, slowly return to your starting position. Repeat with the opposite arm and leg. Repeat 2 times. Complete this exercise once every 1-2 days.     STRENGTHENING - Abdominals and Quadriceps, Straight Leg Raise   Lie on a firm bed or floor with both legs extended in front of you.  Keeping one leg in contact with the floor, bend the other knee so that your foot can rest flat on the floor.  Find your neutral spine, and tense your abdominal muscles to maintain your spinal position throughout the exercise.  Slowly lift your straight leg off the floor about 6 inches for a count of 15, making sure to not hold  your breath.  Still keeping your neutral spine, slowly lower your leg all the way to the floor. Repeat this exercise with each leg 2 times. Complete this exercise once every 1-2 days. POSTURE AND BODY MECHANICS CONSIDERATIONS - Low Back Sprain Keeping correct posture when sitting, standing or completing your activities will reduce the stress put on different body tissues, allowing injured tissues a chance to heal and limiting painful experiences. The following are general guidelines for improved posture. Your physician or physical therapist will provide you with any instructions specific to your needs. While reading these guidelines, remember:  The exercises prescribed by your provider will help you have the flexibility and strength to maintain correct postures.  The correct posture provides the best environment for your joints to work. All of your joints have less wear and tear when properly supported by a spine with good posture. This means you will experience a healthier, less painful body.  Correct posture must be practiced with all of your activities, especially prolonged sitting and standing. Correct posture is as important when doing repetitive low-stress activities (typing) as it is when doing a single heavy-load activity (lifting).  RESTING POSITIONS Consider which positions are most painful for you when choosing a resting position. If you have pain with flexion-based activities (sitting, bending, stooping, squatting), choose a  position that allows you to rest in a less flexed posture. You would want to avoid curling into a fetal position on your side. If your pain worsens with extension-based activities (prolonged standing, working overhead), avoid resting in an extended position such as sleeping on your stomach. Most people will find more comfort when they rest with their spine in a more neutral position, neither too rounded nor too arched. Lying on a non-sagging bed on your side with a pillow between your knees, or on your back with a pillow under your knees will often provide some relief. Keep in mind, being in any one position for a prolonged period of time, no matter how correct your posture, can still lead to stiffness. PROPER SITTING POSTURE In order to minimize stress and discomfort on your spine, you must sit with correct posture. Sitting with good posture should be effortless for a healthy body. Returning to good posture is a gradual process. Many people can work toward this most comfortably by using various supports until they have the flexibility and strength to maintain this posture on their own. When sitting with proper posture, your ears will fall over your shoulders and your shoulders will fall over your hips. You should use the back of the chair to support your upper back. Your lower back will be in a neutral position, just slightly arched. You may place a small pillow or folded towel at the base of your lower back for  support.  When working at a desk, create an environment that supports good, upright posture. Without extra support, muscles tire, which leads to excessive strain on joints and other tissues. Keep these recommendations in mind:  CHAIR:  A chair should be able to slide under your desk when your back makes contact with the back of the chair. This allows you to work closely.  The chair's height should allow your eyes to be level with the upper part of your monitor and your hands to be slightly lower  than your elbows.  BODY POSITION  Your feet should make contact with the floor. If this is not possible, use a foot rest.  Keep your ears over your shoulders.  This will reduce stress on your neck and low back.  INCORRECT SITTING POSTURES  If you are feeling tired and unable to assume a healthy sitting posture, do not slouch or slump. This puts excessive strain on your back tissues, causing more damage and pain. Healthier options include:  Using more support, like a lumbar pillow.  Switching tasks to something that requires you to be upright or walking.  Talking a brief walk.  Lying down to rest in a neutral-spine position.  PROLONGED STANDING WHILE SLIGHTLY LEANING FORWARD  When completing a task that requires you to lean forward while standing in one place for a long time, place either foot up on a stationary 2-4 inch high object to help maintain the best posture. When both feet are on the ground, the lower back tends to lose its slight inward curve. If this curve flattens (or becomes too large), then the back and your other joints will experience too much stress, tire more quickly, and can cause pain.  CORRECT STANDING POSTURES Proper standing posture should be assumed with all daily activities, even if they only take a few moments, like when brushing your teeth. As in sitting, your ears should fall over your shoulders and your shoulders should fall over your hips. You should keep a slight tension in your abdominal muscles to brace your spine. Your tailbone should point down to the ground, not behind your body, resulting in an over-extended swayback posture.   INCORRECT STANDING POSTURES  Common incorrect standing postures include a forward head, locked knees and/or an excessive swayback. WALKING Walk with an upright posture. Your ears, shoulders and hips should all line-up.  PROLONGED ACTIVITY IN A FLEXED POSITION When completing a task that requires you to bend forward at your  waist or lean over a low surface, try to find a way to stabilize 3 out of 4 of your limbs. You can place a hand or elbow on your thigh or rest a knee on the surface you are reaching across. This will provide you more stability, so that your muscles do not tire as quickly. By keeping your knees relaxed, or slightly bent, you will also reduce stress across your lower back. CORRECT LIFTING TECHNIQUES  DO :  Assume a wide stance. This will provide you more stability and the opportunity to get as close as possible to the object which you are lifting.  Tense your abdominals to brace your spine. Bend at the knees and hips. Keeping your back locked in a neutral-spine position, lift using your leg muscles. Lift with your legs, keeping your back straight.  Test the weight of unknown objects before attempting to lift them.  Try to keep your elbows locked down at your sides in order get the best strength from your shoulders when carrying an object.     Always ask for help when lifting heavy or awkward objects. INCORRECT LIFTING TECHNIQUES DO NOT:   Lock your knees when lifting, even if it is a small object.  Bend and twist. Pivot at your feet or move your feet when needing to change directions.  Assume that you can safely pick up even a paperclip without proper posture.

## 2016-10-26 ENCOUNTER — Other Ambulatory Visit: Payer: Self-pay | Admitting: Family Medicine

## 2016-10-26 DIAGNOSIS — I10 Essential (primary) hypertension: Secondary | ICD-10-CM

## 2017-03-01 ENCOUNTER — Other Ambulatory Visit: Payer: Self-pay | Admitting: Family Medicine

## 2017-03-01 DIAGNOSIS — K219 Gastro-esophageal reflux disease without esophagitis: Secondary | ICD-10-CM

## 2017-03-11 ENCOUNTER — Ambulatory Visit: Payer: BLUE CROSS/BLUE SHIELD | Admitting: Family Medicine

## 2017-03-12 ENCOUNTER — Ambulatory Visit: Payer: BLUE CROSS/BLUE SHIELD | Admitting: Family Medicine

## 2017-03-18 ENCOUNTER — Ambulatory Visit: Payer: BLUE CROSS/BLUE SHIELD | Admitting: Family Medicine

## 2017-03-20 ENCOUNTER — Encounter: Payer: Self-pay | Admitting: Family Medicine

## 2017-03-20 ENCOUNTER — Other Ambulatory Visit: Payer: Self-pay | Admitting: Family Medicine

## 2017-03-20 ENCOUNTER — Ambulatory Visit: Payer: BLUE CROSS/BLUE SHIELD | Admitting: Family Medicine

## 2017-03-20 VITALS — BP 122/80 | HR 107 | Temp 98.4°F | Ht 62.0 in | Wt 175.5 lb

## 2017-03-20 DIAGNOSIS — I10 Essential (primary) hypertension: Secondary | ICD-10-CM | POA: Diagnosis not present

## 2017-03-20 DIAGNOSIS — K219 Gastro-esophageal reflux disease without esophagitis: Secondary | ICD-10-CM

## 2017-03-20 DIAGNOSIS — M6208 Separation of muscle (nontraumatic), other site: Secondary | ICD-10-CM | POA: Diagnosis not present

## 2017-03-20 DIAGNOSIS — M67951 Unspecified disorder of synovium and tendon, right thigh: Secondary | ICD-10-CM

## 2017-03-20 DIAGNOSIS — M6798 Unspecified disorder of synovium and tendon, other site: Secondary | ICD-10-CM

## 2017-03-20 MED ORDER — PANTOPRAZOLE SODIUM 40 MG PO TBEC: 40.0000 mg | DELAYED_RELEASE_TABLET | Freq: Every day | ORAL | 1 refills | Status: DC

## 2017-03-20 MED ORDER — PANTOPRAZOLE SODIUM 40 MG PO TBEC: 40.0000 mg | DELAYED_RELEASE_TABLET | Freq: Every day | ORAL | 3 refills | Status: DC

## 2017-03-20 MED ORDER — PANTOPRAZOLE SODIUM 40 MG PO TBEC
40.0000 mg | DELAYED_RELEASE_TABLET | Freq: Every day | ORAL | 1 refills | Status: DC
Start: 2017-03-20 — End: 2017-03-20

## 2017-03-20 NOTE — Progress Notes (Signed)
Pre visit review using our clinic review tool, if applicable. No additional management support is needed unless otherwise documented below in the visit note. 

## 2017-03-20 NOTE — Progress Notes (Signed)
Chief Complaint  Patient presents with  . Hypertension    Subjective Samantha Monroe is a 42 y.o. female who presents for hypertension follow up. She does monitor home blood pressures. Blood pressures ranging from 120-140's/80-90's on average. She is compliant with medications. Patient has these side effects of medication: none She is adhering to a healthy diet overall. Current exercise: none  12 mo, knot in abd region. Notices it more when she lies on her side or bears down. No pain or skin changes. Bloating, early satiety usually when she lies down or during/after meals, no N/V/D/C, bleeding, unintentional wt loss, nighttime awakenings. Has not tried anything at home.    2 weeks had R hip pain, worse when she stands up. No injury/change in activity. Has not tried anything at home. No neurologic s/s's. Will radiate up R side of back.    Past Medical History:  Diagnosis Date  . Anxiety   . Depression   . Dyslipidemia (high LDL; low HDL) 10/12/2015  . Easy bruising 10/26/2015  . Essential hypertension 10/12/2015  . High cholesterol   . High triglycerides   . Hypertension   . Migraines   . Obesity 10/12/2015  . Seizures ([place])    Family History  Problem Relation Age of Onset  . Hypertension Mother   . Diabetes Mother        type II  . High Cholesterol Unknown   . Hyperlipidemia Brother   . Hyperlipidemia Maternal Grandmother   . Hyperlipidemia Maternal Grandfather     Medications Current Outpatient Medications on File Prior to Visit  Medication Sig Dispense Refill  . amLODipine (NORVASC) 5 MG tablet TAKE 1 TABLET (5 MG TOTAL) BY MOUTH DAILY. 90 tablet 3  . fluticasone (FLONASE) 50 MCG/ACT nasal spray Place 2 sprays into both nostrils daily. 16 g 2  . HYDROcodone-acetaminophen (NORCO) 10-325 MG tablet     . metroNIDAZOLE (METROGEL) 0.75 % vaginal gel     . omeprazole (PRILOSEC) 20 MG capsule TAKE ONE CAPSULE BY MOUTH EVERY DAY 90 capsule 3  . sertraline (ZOLOFT) 50 MG tablet  TAKE 1 TABLET (50 MG TOTAL) BY MOUTH DAILY. 90 tablet 0  . triamterene-hydrochlorothiazide (MAXZIDE) 75-50 MG tablet Take 1 tablet by mouth daily.    Marland Kitchen zolpidem (AMBIEN) 10 MG tablet Take 10 mg by mouth at bedtime as needed. For insomina     Allergies Allergies  Allergen Reactions  . Aspirin Other (See Comments)    Eye redness, felt like throat was closing.  . Motrin [Ibuprofen]     Felt like throat was closing  . Shellfish Allergy Other (See Comments)    Felt like throat was closing    Review of Systems Cardiovascular: no chest pain Respiratory:  no shortness of breath  Exam BP 122/80 (BP Location: Left Arm, Patient Position: Sitting, Cuff Size: Normal)   Pulse (!) 107   Temp 98.4 F (36.9 C) (Oral)   Ht 5\' 2"  (1.575 m)   Wt 175 lb 8 oz (79.6 kg)   SpO2 96%   BMI 32.10 kg/m  General:  well developed, well nourished, in no apparent distress Skin: warm, no pallor or diaphoresis Eyes: pupils equal and round, sclera anicteric without injection Heart: RRR- not tachycardic during my exam, no bruits, no LE edema Lungs: clear to auscultation, no accessory muscle use Abd: Soft, NT, ND; bulge presents w valsalva centrally; I do not appreciate any defect in abd wall.  MSK: +TTP over glute med laterally, no TTP over  greater troch, +pain with resisted abduction on R, gait normal Neuro: No cerebellar signs, DTR's equal and symmetric,  Psych: well oriented with normal range of affect and appropriate judgment/insight  Essential hypertension  Gastroesophageal reflux disease, esophagitis presence not specified - Plan: pantoprazole (PROTONIX) 40 MG tablet  Diastasis recti  Tendinopathy of right gluteus medius  Ck BP at home 1 min apart. May need to bring in monitor to compare to our readings. Add PPI. Reassurance for DR. Stretches/exercises, ice, Tylenol. Counseled on diet and exercise F/u in 2 mo or prn. The patient voiced understanding and agreement to the plan.  [place], DO 03/20/17  10:27 AM

## 2017-03-20 NOTE — Patient Instructions (Addendum)
Ice/cold pack over area for 10-15 min every 2-3 hours while awake.  OK to take Tylenol 1000 mg (2 extra strength tabs) or 975 mg (3 regular strength tabs) every 6 hours as needed.  Ibuprofen 400-600 mg (2-3 over the counter strength tabs) every 6 hours as needed for pain.  The only lifestyle changes that have data behind them are weight loss for the overweight/obese and elevating the head of the bed. Finding out which foods/positions are triggers is important.  Check BP twice waiting 1 min between readings. Write them down.  Don't worry about your bulge unless it starts becoming painful or bothersome.   Hip Exercises It is normal to feel mild stretching, pulling, tightness, or discomfort as you do these exercises, but you should stop right away if you feel sudden pain or your pain gets worse.  STRETCHING AND RANGE OF MOTION EXERCISES These exercises warm up your muscles and joints and improve the movement and flexibility of your hip. These exercises also help to relieve pain, numbness, and tingling. Exercise A: Hamstrings, Supine  1. Lie on your back. 2. Loop a belt or towel over the ball of your left / rightfoot. The ball of your foot is on the walking surface, right under your toes. 3. Straighten your left / rightknee and slowly pull on the belt to raise your leg. ? Do not let your left / right knee bend while you do this. ? Keep your other leg flat on the floor. ? Raise the left / right leg until you feel a gentle stretch behind your left / right knee or thigh. 4. Hold this position for 30 seconds. 5. Slowly return your leg to the starting position. Repeat2 times. Complete this stretch 3 times per week. Exercise B: Hip Rotators  1. Lie on your back on a firm surface. 2. Hold your left / right knee with your left / right hand. Hold your ankle with your other hand. 3. Gently pull your left / right knee and rotate your lower leg toward your other shoulder. ? Pull until you feel a  stretch in your buttocks. ? Keep your hips and shoulders firmly planted while you do this stretch. 4. Hold this position for 30 seconds. Repeat 2 times. Complete this stretch 3 times per week. Exercise C: V-Sit (Hamstrings and Adductors)  1. Sit on the floor with your legs extended in a large "V" shape. Keep your knees straight during this exercise. 2. Start with your head and chest upright, then bend at your waist to reach for your left foot (position A). You should feel a stretch in your right inner thigh. 3. Hold this position for 30 seconds. Then slowly return to the upright position. 4. Bend at your waist to reach forward (position B). You should feel a stretch behind both of your thighs and knees. 5. Hold this position for 30 seconds. Then slowly return to the upright position. 6. Bend at your waist to reach for your right foot (position C). You should feel a stretch in your left inner thigh. 7. Hold this position for 30 seconds. Then slowly return to the upright position. Repeat A, B, and C 2 times each. Complete this stretch 3 times per week. Exercise D: Lunge (Hip Flexors)  1. Place your left / right knee on the floor and bend your other knee so that is directly over your ankle. You should be half-kneeling. 2. Keep good posture with your head over your shoulders. 3. Tighten your buttocks  to point your tailbone downward. This helps your back to keep from arching too much. 4. You should feel a gentle stretch in the front of your left / right thigh and hip. If you do not feel any resistance, slightly slide your other foot forward and then slowly lunge forward so your knee once again lines up over your ankle. 5. Make sure your tailbone continues to point downward. 6. Hold this position for 30 seconds. Repeat 2 times. Complete this stretch 3 times per week.  STRENGTHENING EXERCISES These exercises build strength and endurance in your hip. Endurance is the ability to use your muscles for  a long time, even after they get tired. Exercise E: Bridge (Hip Extensors)  1. Lie on your back on a firm surface with your knees bent and your feet flat on the floor. 2. Tighten your buttocks muscles and lift your bottom off the floor until the trunk of your body is level with your thighs. ? Do not arch your back. ? You should feel the muscles working in your buttocks and the back of your thighs. If you do not feel these muscles, slide your feet 1-2 inches (2.5-5 cm) farther away from your buttocks. 3. Hold this position for 3 seconds. 4. Slowly lower your hips to the starting position. Repeat for a total of 10 repetitions. 5. Let your muscles relax completely between repetitions. 6. If this exercise is too easy, try doing it with your arms crossed over your chest. Repeat 2 times. Complete this exercise 3 times per week. Exercise F: Straight Leg Raises - Hip Abductors  1. Lie on your side with your left / right leg in the top position. Lie so your head, shoulder, knee, and hip line up with each other. You may bend your bottom knee to help you balance. 2. Roll your hips slightly forward, so your hips are stacked directly over each other and your left / right knee is facing forward. 3. Leading with your heel, lift your top leg 4-6 inches (10-15 cm). You should feel the muscles in your outer hip lifting. ? Do not let your foot drift forward. ? Do not let your knee roll toward the ceiling. 4. Hold this position for 1 second. 5. Slowly return to the starting position. 6. Let your muscles relax completely between repetitions. Repeat for a total of 10 repetitions.  Repeat 2 times. Complete this exercise 3 times per week. Exercise G: Straight Leg Raises - Hip Adductors  1. Lie on your side with your left / right leg in the bottom position. Lie so your head, shoulder, knee, and hip line up. You may place your upper foot in front to help you balance. 2. Roll your hips slightly forward, so your hips  are stacked directly over each other and your left / right knee is facing forward. 3. Tense the muscles in your inner thigh and lift your bottom leg 4-6 inches (10-15 cm). 4. Hold this position for 1 second. 5. Slowly return to the starting position. 6. Let your muscles relax completely between repetitions. Repeat for a total of 10 repetitions. Repeat 2 times. Complete this exercise 3 times per week. Exercise H: Straight Leg Raises - Quadriceps  1. Lie on your back with your left / right leg extended and your other knee bent. 2. Tense the muscles in the front of your left / right thigh. When you do this, you should see your kneecap slide up or see increased dimpling just above your  knee. 3. Tighten these muscles even more and raise your leg 4-6 inches (10-15 cm) off the floor. 4. Hold this position for 3 seconds. 5. Keep these muscles tense as you lower your leg. 6. Relax the muscles slowly and completely between repetitions. Repeat for a total of 10 repetitions. Repeat 2 times. Complete this exercise 3 times per week. Exercise I: Hip Abductors, Standing 1. Tie one end of a rubber exercise band or tubing to a secure surface, such as a table or pole. 2. Loop the other end of the band or tubing around your left / right ankle. 3. Keeping your ankle with the band or tubing directly opposite of the secured end, step away until there is tension in the tubing or band. Hold onto a chair as needed for balance. 4. Lift your left / right leg out to your side. While you do this: ? Keep your back upright. ? Keep your shoulders over your hips. ? Keep your toes pointing forward. ? Make sure to use your hip muscles to lift your leg. Do not "throw" your leg or tip your body to lift your leg. 5. Hold this position for 1 second. 6. Slowly return to the starting position. Repeat for a total of 10 repetitions. Repeat 2 times. Complete this exercise 3 times per week. Exercise J: Squats (Quadriceps) 1. Stand in  a door frame so your feet and knees are in line with the frame. You may place your hands on the frame for balance. 2. Slowly bend your knees and lower your hips like you are going to sit in a chair. ? Keep your lower legs in a straight-up-and-down position. ? Do not let your hips go lower than your knees. ? Do not bend your knees lower than told by your health care provider. ? If your hip pain increases, do not bend as low. 3. Hold this position for 1 second. 4. Slowly push with your legs to return to standing. Do not use your hands to pull yourself to standing. Repeat for a total of 10 repetitions. Repeat 2 times. Complete this exercise 3 times per week. Make sure you discuss any questions you have with your health care provider. Document Released: 02/21/2005 Document Revised: 10/29/2015 Document Reviewed: 01/29/2015 Elsevier Interactive Patient Education  [employer].

## 2017-04-20 DIAGNOSIS — J101 Influenza due to other identified influenza virus with other respiratory manifestations: Secondary | ICD-10-CM | POA: Diagnosis not present

## 2017-06-24 DIAGNOSIS — Z6831 Body mass index (BMI) 31.0-31.9, adult: Secondary | ICD-10-CM | POA: Diagnosis not present

## 2017-06-24 DIAGNOSIS — Z1231 Encounter for screening mammogram for malignant neoplasm of breast: Secondary | ICD-10-CM | POA: Diagnosis not present

## 2017-06-24 DIAGNOSIS — Z01419 Encounter for gynecological examination (general) (routine) without abnormal findings: Secondary | ICD-10-CM | POA: Diagnosis not present

## 2017-06-30 ENCOUNTER — Encounter: Payer: Self-pay | Admitting: Gastroenterology

## 2017-06-30 DIAGNOSIS — J039 Acute tonsillitis, unspecified: Secondary | ICD-10-CM | POA: Diagnosis not present

## 2017-07-23 ENCOUNTER — Other Ambulatory Visit (INDEPENDENT_AMBULATORY_CARE_PROVIDER_SITE_OTHER): Payer: BLUE CROSS/BLUE SHIELD

## 2017-07-23 ENCOUNTER — Encounter: Payer: Self-pay | Admitting: Gastroenterology

## 2017-07-23 ENCOUNTER — Ambulatory Visit: Payer: BLUE CROSS/BLUE SHIELD | Admitting: Gastroenterology

## 2017-07-23 VITALS — BP 124/86 | HR 98 | Ht 62.0 in | Wt 174.0 lb

## 2017-07-23 DIAGNOSIS — R1013 Epigastric pain: Secondary | ICD-10-CM | POA: Diagnosis not present

## 2017-07-23 DIAGNOSIS — K219 Gastro-esophageal reflux disease without esophagitis: Secondary | ICD-10-CM | POA: Diagnosis not present

## 2017-07-23 DIAGNOSIS — R140 Abdominal distension (gaseous): Secondary | ICD-10-CM

## 2017-07-23 LAB — CBC WITH DIFFERENTIAL/PLATELET
BASOS ABS: 0.1 10*3/uL (ref 0.0–0.1)
Basophils Relative: 0.8 % (ref 0.0–3.0)
Eosinophils Absolute: 0.3 10*3/uL (ref 0.0–0.7)
Eosinophils Relative: 4.8 % (ref 0.0–5.0)
HCT: 37 % (ref 36.0–46.0)
Hemoglobin: 12.1 g/dL (ref 12.0–15.0)
LYMPHS ABS: 2.3 10*3/uL (ref 0.7–4.0)
Lymphocytes Relative: 32.3 % (ref 12.0–46.0)
MCHC: 32.7 g/dL (ref 30.0–36.0)
MCV: 82 fl (ref 78.0–100.0)
MONO ABS: 0.6 10*3/uL (ref 0.1–1.0)
Monocytes Relative: 8.1 % (ref 3.0–12.0)
NEUTROS ABS: 3.9 10*3/uL (ref 1.4–7.7)
NEUTROS PCT: 54 % (ref 43.0–77.0)
PLATELETS: 367 10*3/uL (ref 150.0–400.0)
RBC: 4.51 Mil/uL (ref 3.87–5.11)
RDW: 17 % — ABNORMAL HIGH (ref 11.5–15.5)
WBC: 7.2 10*3/uL (ref 4.0–10.5)

## 2017-07-23 LAB — COMPREHENSIVE METABOLIC PANEL
ALK PHOS: 78 U/L (ref 39–117)
ALT: 20 U/L (ref 0–35)
AST: 31 U/L (ref 0–37)
Albumin: 4.6 g/dL (ref 3.5–5.2)
BILIRUBIN TOTAL: 0.4 mg/dL (ref 0.2–1.2)
BUN: 12 mg/dL (ref 6–23)
CO2: 28 meq/L (ref 19–32)
CREATININE: 0.66 mg/dL (ref 0.40–1.20)
Calcium: 10 mg/dL (ref 8.4–10.5)
Chloride: 96 mEq/L (ref 96–112)
GFR: 125.28 mL/min (ref >60.00)
Glucose, Bld: 107 mg/dL — ABNORMAL HIGH (ref 70–99)
Potassium: 3.7 mEq/L (ref 3.5–5.1)
Sodium: 135 mEq/L (ref 135–145)
TOTAL PROTEIN: 8.1 g/dL (ref 6.0–8.3)

## 2017-07-23 LAB — TSH: TSH: 1.83 u[IU]/mL (ref 0.35–4.50)

## 2017-07-23 MED ORDER — PANTOPRAZOLE SODIUM 40 MG PO TBEC: 40.0000 mg | DELAYED_RELEASE_TABLET | Freq: Two times a day (BID) | ORAL | 3 refills | Status: DC

## 2017-07-23 NOTE — Patient Instructions (Signed)
Increase Pantoprazole to 40 mg twice a day  Benefiber or Citrucel powder fiber daily.   Your provider has requested that you go to the basement level for lab work before leaving today. Press "B" on the elevator. The lab is located at the first door on the left as you exit the elevator.  You have been scheduled for an endoscopy. Please follow written instructions given to you at your visit today. If you use inhalers (even only as needed), please bring them with you on the day of your procedure. Your physician has requested that you go to www.startemmi.com and enter the access code given to you at your visit today. This web site gives a general overview about your procedure. However, you should still follow specific instructions given to you by our office regarding your preparation for the procedure.

## 2017-07-23 NOTE — Progress Notes (Addendum)
07/23/2017 Samantha Monroe [id number] 1976-01-18   HISTORY OF PRESENT ILLNESS: This is a 42 year old female who is new to our office.  She was referred here by her PCP, Dr. Nani Ravens and her OB/GYN, Dr. Helane Rima, for a couple of different issues.  First, the patient tells me that her mother had colon polyps on her colonoscopy.  This was apparently performed in [place], [place].  She says that her mother's physician told her that because she had polyps she needed to have her children undergo colonoscopy as well.  From what the patient tells me her mother was told to return in 52 years for another colonoscopy.  The patient denies any issues with her bowels except for some very mild constipation.  She says that she does have 1 or 2 bowel movements daily, but they are usually formed stools, with only mild straining.  She says that she drinks lots of fluids throughout the day.  She denies any rectal bleeding.   She does have some upper GI complaints, however.  She tells me that for the past 6 to 8 months she has been having a lot of reflux and epigastric abdominal pain.  She says that even when she eats a little bit she feels so full and gets very bloated in her upper abdomen.  She says it almost feels like there is a knot in her epigastrium.  This occurs about every time that she eats.  She sometimes has nausea first thing in the morning and occasional vomits.  Sometimes this vomiting produce undigested food from the evening before.  Occasionally eats late at night.  She was started on pantoprazole 40 mg daily by her PCP about 5 months ago and that has helped with the reflux, but still has the other symptoms.  Denies NSAID use.  She denies weight loss, actually has had a gradual increase in weight over the past year despite trying to have a better diet.   Past Medical History:  Diagnosis Date  . Anxiety   . Depression   . Dyslipidemia (high LDL; low HDL) 10/12/2015  . Easy bruising 10/26/2015  .  Essential hypertension 10/12/2015  . GERD (gastroesophageal reflux disease)   . High cholesterol   . High triglycerides   . Hypertension   . Migraines   . Obesity 10/12/2015  . Seizures ([place])   . Sleep apnea    Past Surgical History:  Procedure Laterality Date  . APPENDECTOMY    . CESAREAN SECTION     3 C sections  . DILITATION & CURRETTAGE/HYSTROSCOPY WITH THERMACHOICE ABLATION  01/09/2012   Procedure: DILATATION & CURETTAGE/HYSTEROSCOPY WITH THERMACHOICE ABLATION;  Surgeon: Cyril Mourning, MD;  Location: [facility];  Service: Gynecology;  Laterality: N/A;  . TUBAL LIGATION      reports that she has never smoked. She has never used smokeless tobacco. She reports that she drinks alcohol. She reports that she does not use drugs. family history includes Colon polyps in her mother; Diabetes in her mother; High Cholesterol in her unknown relative; Hyperlipidemia in her brother, maternal grandfather, and maternal grandmother; Hypertension in her mother. Allergies  Allergen Reactions  . Aspirin Other (See Comments)    Eye redness, felt like throat was closing.  . Motrin [Ibuprofen]     Felt like throat was closing  . Shellfish Allergy Other (See Comments)    Felt like throat was closing      Outpatient Encounter Medications as of 07/23/2017  Medication Sig  .  amLODipine (NORVASC) 5 MG tablet TAKE 1 TABLET (5 MG TOTAL) BY MOUTH DAILY.  Marland Kitchen escitalopram (LEXAPRO) 10 MG tablet Take 10 mg by mouth daily.  . pantoprazole (PROTONIX) 40 MG tablet Take 1 tablet (40 mg total) by mouth daily.  Marland Kitchen triamterene-hydrochlorothiazide (MAXZIDE) 75-50 MG tablet Take 1 tablet by mouth daily.  Marland Kitchen zolpidem (AMBIEN) 10 MG tablet Take 10 mg by mouth at bedtime as needed. For insomina  . [DISCONTINUED] sertraline (ZOLOFT) 50 MG tablet TAKE 1 TABLET (50 MG TOTAL) BY MOUTH DAILY.   No facility-administered encounter medications on file as of 07/23/2017.      REVIEW OF SYSTEMS  : All other systems reviewed and  negative except where noted in the History of Present Illness.   PHYSICAL EXAM: BP 124/86   Pulse 98   Ht 5\' 2"  (1.575 m)   Wt 174 lb (78.9 kg)   LMP 07/16/2017 (Approximate)   BMI 31.83 kg/m  General: Well developed female in no acute distress Head: Normocephalic and atraumatic Eyes:  Sclerae anicteric, conjunctiva pink. Ears: Normal auditory acuity Lungs: Clear throughout to auscultation; no increased WOB. Heart: Regular rate and rhythm; no M/R/G. Abdomen: Soft, non-distended.  BS present.  Epigastric TTP. Musculoskeletal: Symmetrical with no gross deformities  Skin: No lesions on visible extremities Extremities: No edema  Neurological: Alert oriented x 4, grossly non-focal Psychological:  Alert and cooperative. Normal mood and affect  ASSESSMENT AND PLAN: *Epigastric pain, GERD, and upper abdominal bloating:  Reflux improved with PPI but still has other complaints despite the medication.  Will have her increase her pantoprazole to BID for now.  Will schedule for EGD with Dr. Hilarie Fredrickson. *Mild constipation:  Has 1-2 BM's per day with mild straining.  Advised to try daily powder fiber supplement such as Benefiber or Citrucel. *Family history of colon polyps in her mother:  Patient tells me that her mother's GI doctor (in [place]) told her that her children needed to have colonoscopies done early.  From what patient tells me her mother was told to come back in 5 years which is consistent with standard guidelines for adenomatous polyps and does not seem like there were several, large, or advanced polyps removed.  That being said, I do not think that the patient needs colonoscopy at this time.  She is of AA ethnicity, which would qualify her for colonoscopy at 42 years of age, however.  Patient understands and will return at that time for screening.  **Will check general labs including CBC, CMP, and TSH as well. **The risks, benefits, and alternatives to EGD were discussed with the patient and  she consents to proceed.   CC:  Shelda Pal*  CC:  Dr. Helane Rima  Addendum: Reviewed and agree with initial management. Given family history of colon polyps, I would recommend elevated risk screening based on family history of polyps.  Given recommendation for avg risk at 42 yo, I would begin screening now. Please add colonoscopy to same day as EGD. Pyrtle, Lajuan Lines, MD

## 2017-07-24 ENCOUNTER — Encounter: Payer: Self-pay | Admitting: Internal Medicine

## 2017-07-28 ENCOUNTER — Telehealth: Payer: Self-pay

## 2017-07-28 NOTE — Telephone Encounter (Signed)
Left message on machine to call back  

## 2017-07-28 NOTE — Telephone Encounter (Signed)
Addendum: Reviewed and agree with initial management. Given family history of colon polyps, I would recommend elevated risk screening based on family history of polyps.  Given recommendation for avg risk at 42 yo, I would begin screening now. Please add colonoscopy to same day as EGD. Pyrtle, Lajuan Lines, MD

## 2017-07-29 NOTE — Telephone Encounter (Signed)
The pt has been advised and previsit and endo/colon scheduled.  All questions answered.

## 2017-07-29 NOTE — Telephone Encounter (Signed)
Patient returning call to Patty.  °

## 2017-07-31 ENCOUNTER — Encounter: Payer: BLUE CROSS/BLUE SHIELD | Admitting: Internal Medicine

## 2017-08-11 ENCOUNTER — Telehealth: Payer: Self-pay | Admitting: Gastroenterology

## 2017-08-11 NOTE — Telephone Encounter (Signed)
The pt notified that no new information and no new voice mails.  The message she received may be an old voice mail.  The pt thanked me for calling

## 2017-08-11 NOTE — Telephone Encounter (Signed)
Pls call patient. She stated that she was returning your call.

## 2017-08-20 ENCOUNTER — Other Ambulatory Visit: Payer: Self-pay

## 2017-08-20 ENCOUNTER — Emergency Department (HOSPITAL_BASED_OUTPATIENT_CLINIC_OR_DEPARTMENT_OTHER)
Admission: EM | Admit: 2017-08-20 | Discharge: 2017-08-20 | Disposition: A | Discharge status: Home / Self Care | Payer: BLUE CROSS/BLUE SHIELD | Attending: Emergency Medicine | Admitting: Emergency Medicine

## 2017-08-20 ENCOUNTER — Encounter (HOSPITAL_BASED_OUTPATIENT_CLINIC_OR_DEPARTMENT_OTHER): Payer: Self-pay | Admitting: *Deleted

## 2017-08-20 DIAGNOSIS — Z79899 Other long term (current) drug therapy: Secondary | ICD-10-CM | POA: Diagnosis not present

## 2017-08-20 DIAGNOSIS — M25572 Pain in left ankle and joints of left foot: Secondary | ICD-10-CM

## 2017-08-20 DIAGNOSIS — I10 Essential (primary) hypertension: Secondary | ICD-10-CM | POA: Diagnosis not present

## 2017-08-20 NOTE — ED Triage Notes (Signed)
pt c/o left outer ankle pain w/o injury x 1 day

## 2017-08-20 NOTE — ED Provider Notes (Signed)
[facility]PART[facility] Provider Note   CSN: [id number] Arrival date & time: 08/20/17  1011     History   Chief Complaint Chief Complaint  Patient presents with  . Ankle Pain    HPI Samantha Monroe is a 42 y.o. female who presents with L ankle pain. PMH significant for HTN, HLD, seizures, migraines. She states that yesterday at work she felt a sharp pain in the area while walking. The pain improved and she was able to continue her day without any problems (walked the dog, etc). She went to sleep and this morning woke up with moderate-severe ankle pain which has been gradually worsening and associated swelling. She cannot recall any other injuries. No fever, calf swelling or tenderness. She has never had this before. No meds tried prior to arrival. She went to [facility] and was told to come here because there was no trauma and they wanted to rule out a blood clot.  HPI  Past Medical History:  Diagnosis Date  . Anxiety   . Depression   . Dyslipidemia (high LDL; low HDL) 10/12/2015  . Easy bruising 10/26/2015  . Essential hypertension 10/12/2015  . GERD (gastroesophageal reflux disease)   . High cholesterol   . High triglycerides   . Hypertension   . Migraines   . Obesity 10/12/2015  . Seizures ([place])   . Sleep apnea     Patient Active Problem List   Diagnosis Date Noted  . Abdominal pain, epigastric 07/23/2017  . Bloating 07/23/2017  . Gastroesophageal reflux disease 03/20/2017  . Diastasis recti 03/20/2017  . Easy bruising 10/26/2015  . Depression with anxiety 10/26/2015  . Obesity 10/12/2015  . Dyslipidemia (high LDL; low HDL) 10/12/2015  . Essential hypertension 10/12/2015    Past Surgical History:  Procedure Laterality Date  . APPENDECTOMY    . CESAREAN SECTION     3 C sections  . DILITATION & CURRETTAGE/HYSTROSCOPY WITH THERMACHOICE ABLATION  01/09/2012   Procedure: DILATATION & CURETTAGE/HYSTEROSCOPY WITH THERMACHOICE ABLATION;  Surgeon: Cyril Mourning, MD;  Location: [facility];  Service: Gynecology;  Laterality: N/A;  . TUBAL LIGATION       OB History   None      Home Medications    Prior to Admission medications   Medication Sig Start Date End Date Taking? Authorizing Provider  amLODipine (NORVASC) 5 MG tablet TAKE 1 TABLET (5 MG TOTAL) BY MOUTH DAILY. 10/30/16   Shelda Pal, DO  escitalopram (LEXAPRO) 10 MG tablet Take 10 mg by mouth daily.    [provider]  pantoprazole (PROTONIX) 40 MG tablet Take 1 tablet (40 mg total) by mouth 2 (two) times daily before a meal. 07/23/17   Zehr, Laban Emperor, PA-C  triamterene-hydrochlorothiazide (MAXZIDE) 75-50 MG tablet Take 1 tablet by mouth daily. 09/01/15   [provider]  zolpidem (AMBIEN) 10 MG tablet Take 10 mg by mouth at bedtime as needed. For insomina    [provider]    Family History Family History  Problem Relation Age of Onset  . Hypertension Mother   . Diabetes Mother        type II  . Colon polyps Mother   . High Cholesterol Unknown   . Hyperlipidemia Brother   . Hyperlipidemia Maternal Grandmother   . Hyperlipidemia Maternal Grandfather   . Colon cancer Neg Hx     Social History Social History   Tobacco Use  . Smoking status: Never Smoker  . Smokeless tobacco: Never  Used  Substance Use Topics  . Alcohol use: Yes    Comment: occasional/ 2 or 3 weekly  . Drug use: No     Allergies   Aspirin; Motrin [ibuprofen]; and Shellfish allergy   Review of Systems Review of Systems  Constitutional: Negative for fever.  Musculoskeletal: Positive for arthralgias and joint swelling.     Physical Exam Updated Vital Signs BP (!) 158/104   Pulse (!) 114   Temp 98.4 F (36.9 C)   Resp 16   Ht 5\' 2"  (1.575 m)   Wt 72.6 kg (160 lb)   LMP 08/05/2017   SpO2 99%   BMI 29.26 kg/m   Physical Exam  Constitutional: She is oriented to person, place, and time. She appears well-developed and well-nourished. No distress.    HENT:  Head: Normocephalic and atraumatic.  Eyes: Pupils are equal, round, and reactive to light. Conjunctivae are normal. Right eye exhibits no discharge. Left eye exhibits no discharge. No scleral icterus.  Neck: Normal range of motion.  Cardiovascular: Normal rate.  Pulmonary/Chest: Effort normal. No respiratory distress.  Abdominal: She exhibits no distension.  Musculoskeletal:  Left ankle: Very mild swelling over the lateral ankle. Tender to palpation over the ATLF ligament. 2+ DP pulse. Achilles is intact. Normal cap refill. No foot tenderness  No calf swelling or tenderness  Neurological: She is alert and oriented to person, place, and time.  Skin: Skin is warm and dry.  Psychiatric: She has a normal mood and affect. Her behavior is normal.  Nursing note and vitals reviewed.    ED Treatments / Results  Labs (all labs ordered are listed, but only abnormal results are displayed) Labs Reviewed - No data to display  EKG None  Radiology No results found.  Procedures Procedures (including critical care time)  Medications Ordered in ED Medications - No data to display   Initial Impression / Assessment and Plan / ED Course  I have reviewed the triage vital signs and the nursing notes.  Pertinent labs & imaging results that were available during my care of the patient were reviewed by me and considered in my medical decision making (see chart for details).  42 year old female with atraumatic left ankle pain. Most likely soft tissue injury as she felt a sharp pain while walking yesterday. Imaging was offered - patient deferred which I think is reasonable. Exam is not consistent with DVT or septic joint. Advised RICE, NSAIDs, and re-evaluation next week if not improving.  Final Clinical Impressions(s) / ED Diagnoses   Final diagnoses:  Acute left ankle pain    ED Discharge Orders    None       Recardo Evangelist, PA-C 08/20/17 1052    Blanchie Dessert,  MD 08/21/17 1511

## 2017-08-20 NOTE — Discharge Instructions (Signed)
Rest - please stay off ankle as much as possible Ice - ice for 20 minutes at a time, several times a day Compression - wear brace to provide support Elevate - elevate ankle above level of heart Ibuprofen - take with food. Take up to 3-4 times daily

## 2017-09-24 ENCOUNTER — Ambulatory Visit: Payer: BLUE CROSS/BLUE SHIELD

## 2017-09-24 VITALS — Ht 62.0 in | Wt 170.0 lb

## 2017-09-24 DIAGNOSIS — Z1211 Encounter for screening for malignant neoplasm of colon: Secondary | ICD-10-CM

## 2017-09-24 MED ORDER — NA SULFATE-K SULFATE-MG SULF 17.5-3.13-1.6 GM/177ML PO SOLN: 1.0000 | Freq: Once | ORAL | 0 refills | Status: AC

## 2017-09-24 NOTE — Progress Notes (Signed)
Denies allergies to eggs or soy products. Denies complication of anesthesia or sedation. Denies use of weight loss medication. Denies use of O2.   Emmi instructions declined.  

## 2017-09-30 ENCOUNTER — Ambulatory Visit (AMBULATORY_SURGERY_CENTER): Payer: BLUE CROSS/BLUE SHIELD | Admitting: Internal Medicine

## 2017-09-30 ENCOUNTER — Encounter: Payer: Self-pay | Admitting: Internal Medicine

## 2017-09-30 VITALS — BP 143/94 | HR 80 | Temp 98.4°F | Resp 25 | Ht 62.0 in | Wt 170.0 lb

## 2017-09-30 DIAGNOSIS — K2970 Gastritis, unspecified, without bleeding: Secondary | ICD-10-CM

## 2017-09-30 DIAGNOSIS — B9681 Helicobacter pylori [H. pylori] as the cause of diseases classified elsewhere: Secondary | ICD-10-CM

## 2017-09-30 DIAGNOSIS — D123 Benign neoplasm of transverse colon: Secondary | ICD-10-CM | POA: Diagnosis not present

## 2017-09-30 DIAGNOSIS — Z1211 Encounter for screening for malignant neoplasm of colon: Secondary | ICD-10-CM | POA: Diagnosis not present

## 2017-09-30 DIAGNOSIS — K449 Diaphragmatic hernia without obstruction or gangrene: Secondary | ICD-10-CM | POA: Diagnosis not present

## 2017-09-30 DIAGNOSIS — K2950 Unspecified chronic gastritis without bleeding: Secondary | ICD-10-CM

## 2017-09-30 DIAGNOSIS — Z8371 Family history of colonic polyps: Secondary | ICD-10-CM | POA: Diagnosis not present

## 2017-09-30 DIAGNOSIS — R1013 Epigastric pain: Secondary | ICD-10-CM

## 2017-09-30 MED ORDER — SODIUM CHLORIDE 0.9 % IV SOLN: 500.0000 mL | Freq: Once | INTRAVENOUS | Status: DC

## 2017-09-30 NOTE — Progress Notes (Signed)
A and O x3. Report to RN. Tolerated MAC anesthesia well.Teeth unchanged after procedure.

## 2017-09-30 NOTE — Progress Notes (Signed)
Called to room to assist during endoscopic procedure.  Patient ID and intended procedure confirmed with present staff. Received instructions for my participation in the procedure from the performing physician.  

## 2017-09-30 NOTE — Patient Instructions (Signed)
Continue present medications. Please read handout on Hiatal Hernia. Please read handout on Diverticulosis and Polyps. No Aspirin, Ibuprofen, Naproxen, or other non-steriodal anti-inflammatory drugs for one week. Clip Card.    YOU HAD AN ENDOSCOPIC PROCEDURE TODAY AT [facility]:   Refer to the procedure report that was given to you for any specific questions about what was found during the examination.  If the procedure report does not answer your questions, please call your gastroenterologist to clarify.  If you requested that your care partner not be given the details of your procedure findings, then the procedure report has been included in a sealed envelope for you to review at your convenience later.  YOU SHOULD EXPECT: Some feelings of bloating in the abdomen. Passage of more gas than usual.  Walking can help get rid of the air that was put into your GI tract during the procedure and reduce the bloating. If you had a lower endoscopy (such as a colonoscopy or flexible sigmoidoscopy) you may notice spotting of blood in your stool or on the toilet paper. If you underwent a bowel prep for your procedure, you may not have a normal bowel movement for a few days.  Please Note:  You might notice some irritation and congestion in your nose or some drainage.  This is from the oxygen used during your procedure.  There is no need for concern and it should clear up in a day or so.  SYMPTOMS TO REPORT IMMEDIATELY:   Following lower endoscopy (colonoscopy or flexible sigmoidoscopy):  Excessive amounts of blood in the stool  Significant tenderness or worsening of abdominal pains  Swelling of the abdomen that is new, acute  Fever of 100F or higher   Following upper endoscopy (EGD)  Vomiting of blood or coffee ground material  New chest pain or pain under the shoulder blades  Painful or persistently difficult swallowing  New shortness of breath  Fever of 100F or higher  Black,  tarry-looking stools  For urgent or emergent issues, a gastroenterologist can be reached at any hour by calling [phone].   DIET:  We do recommend a small meal at first, but then you may proceed to your regular diet.  Drink plenty of fluids but you should avoid alcoholic beverages for 24 hours.  ACTIVITY:  You should plan to take it easy for the rest of today and you should NOT DRIVE or use heavy machinery until tomorrow (because of the sedation medicines used during the test).    FOLLOW UP: Our staff will call the number listed on your records the next business day following your procedure to check on you and address any questions or concerns that you may have regarding the information given to you following your procedure. If we do not reach you, we will leave a message.  However, if you are feeling well and you are not experiencing any problems, there is no need to return our call.  We will assume that you have returned to your regular daily activities without incident.  If any biopsies were taken you will be contacted by phone or by letter within the next 1-3 weeks.  Please call us at [phone] if you have not heard about the biopsies in 3 weeks.    SIGNATURES/CONFIDENTIALITY: You and/or your care partner have signed paperwork which will be entered into your electronic medical record.  These signatures attest to the fact that that the information above on your After Visit Summary has  been reviewed and is understood.  Full responsibility of the confidentiality of this discharge information lies with you and/or your care-partner.

## 2017-09-30 NOTE — Progress Notes (Signed)
Pt's states no medical or surgical changes since previsit or office visit. 

## 2017-09-30 NOTE — Op Note (Signed)
[place] Patient Name: Samantha Monroe Procedure Date: 09/30/2017 3:22 PM MRN: [id number] Endoscopist: Jerene Bears , MD Age: 42 Referring MD:  Date of Birth: Jun 19, 1975 Gender: Female Account #: [id number] Procedure:                Upper GI endoscopy Indications:              Epigastric abdominal pain, Heartburn Medicines:                Monitored Anesthesia Care Procedure:                Pre-Anesthesia Assessment:                           - Prior to the procedure, a History and Physical                            was performed, and patient medications and                            allergies were reviewed. The patient's tolerance of                            previous anesthesia was also reviewed. The risks                            and benefits of the procedure and the sedation                            options and risks were discussed with the patient.                            All questions were answered, and informed consent                            was obtained. Prior Anticoagulants: The patient has                            taken no previous anticoagulant or antiplatelet                            agents. ASA Grade Assessment: II - A patient with                            mild systemic disease. After reviewing the risks                            and benefits, the patient was deemed in                            satisfactory condition to undergo the procedure.                           After obtaining informed consent, the endoscope was  passed under direct vision. Throughout the                            procedure, the patient's blood pressure, pulse, and                            oxygen saturations were monitored continuously. The                            Endoscope was introduced through the mouth, and                            advanced to the second part of duodenum. The upper                            GI endoscopy was  accomplished without difficulty.                            The patient tolerated the procedure well. Scope In: Scope Out: Findings:                 The examined esophagus was normal.                           A 3 cm hiatal hernia was present.                           The entire examined stomach was normal. Biopsies                            were taken with a cold forceps for histology and                            Helicobacter pylori testing.                           The examined duodenum was normal. Complications:            No immediate complications. Estimated Blood Loss:     Estimated blood loss was minimal. Impression:               - Normal esophagus.                           - 3 cm hiatal hernia.                           - Normal stomach. Biopsied.                           - Normal examined duodenum. Recommendation:           - Patient has a contact number available for                            emergencies. The signs and symptoms of potential  delayed complications were discussed with the                            patient. Return to normal activities tomorrow.                            Written discharge instructions were provided to the                            patient.                           - Resume previous diet.                           - Continue present medications.                           - Await pathology results. Jerene Bears, MD 09/30/2017 4:02:01 PM This report has been signed electronically.

## 2017-09-30 NOTE — Op Note (Signed)
[place] Patient Name: Samantha Monroe Procedure Date: 09/30/2017 3:20 PM MRN: [id number] Endoscopist: Jerene Bears , MD Age: 42 Referring MD:  Date of Birth: 1975-03-09 Gender: Female Account #: [id number] Procedure:                Colonoscopy Indications:              Colon cancer screening in patient at increased                            risk: Family history of 1st-degree relative with                            colon polyps before age 36 years, This is the                            patient's first colonoscopy Medicines:                Monitored Anesthesia Care Procedure:                Pre-Anesthesia Assessment:                           - Prior to the procedure, a History and Physical                            was performed, and patient medications and                            allergies were reviewed. The patient's tolerance of                            previous anesthesia was also reviewed. The risks                            and benefits of the procedure and the sedation                            options and risks were discussed with the patient.                            All questions were answered, and informed consent                            was obtained. Prior Anticoagulants: The patient has                            taken no previous anticoagulant or antiplatelet                            agents. ASA Grade Assessment: II - A patient with                            mild systemic disease. After reviewing the risks  and benefits, the patient was deemed in                            satisfactory condition to undergo the procedure.                           After obtaining informed consent, the colonoscope                            was passed under direct vision. Throughout the                            procedure, the patient's blood pressure, pulse, and                            oxygen saturations were monitored  continuously. The                            Model PCF-H190DL [id number]) scope was introduced                            through the anus and advanced to the cecum,                            identified by appendiceal orifice and ileocecal                            valve. The colonoscopy was performed without                            difficulty. The patient tolerated the procedure                            well. The quality of the bowel preparation was                            good. The ileocecal valve, appendiceal orifice, and                            rectum were photographed. Scope In: 3:39:30 PM Scope Out: 3:58:53 PM Scope Withdrawal Time: 0 hours 12 minutes 55 seconds  Total Procedure Duration: 0 hours 19 minutes 23 seconds  Findings:                 The digital rectal exam was normal.                           A 5 mm polyp was found in the transverse colon. The                            polyp was sessile. The polyp was removed with a                            cold snare. Resection and retrieval were complete.  After cold snare polypectomy there was persistent                            and active oozing. For hemostasis, one hemostatic                            clip was successfully placed (MR conditional).                            There was no bleeding at the end of the maneuver.                           A few small-mouthed diverticula were found in the                            descending colon.                           Internal hemorrhoids were found during                            retroflexion. The hemorrhoids were small.                           The exam was otherwise without abnormality. Complications:            No immediate complications. Estimated Blood Loss:     Estimated blood loss was minimal. Impression:               - One 5 mm polyp in the transverse colon, removed                            with a cold snare. Resected and  retrieved. Clip (MR                            conditional) was placed.                           - Diverticulosis in the descending colon.                           - Small internal hemorrhoids.                           - The examination was otherwise normal. Recommendation:           - Patient has a contact number available for                            emergencies. The signs and symptoms of potential                            delayed complications were discussed with the                            patient. Return to normal activities  tomorrow.                            Written discharge instructions were provided to the                            patient.                           - Resume previous diet.                           - Continue present medications.                           - No aspirin, ibuprofen, naproxen, or other                            non-steroidal anti-inflammatory drugs for 1 week                            after polyp removal.                           - Await pathology results.                           - Repeat colonoscopy is recommended for                            surveillance. The colonoscopy date will be                            determined after pathology results from today's                            exam become available for review. Jerene Bears, MD 09/30/2017 4:05:28 PM This report has been signed electronically.

## 2017-10-01 ENCOUNTER — Telehealth: Payer: Self-pay

## 2017-10-01 NOTE — Telephone Encounter (Signed)
  Follow up Call-  Call back number 09/30/2017  Post procedure Call Back phone  # [phone]  Permission to leave phone message Yes  Some recent data might be hidden     Patient questions:  Do you have a fever, pain , or abdominal swelling? No. Pain Score  0 *  Have you tolerated food without any problems? Yes.    Have you been able to return to your normal activities? Yes.    Do you have any questions about your discharge instructions: Diet   No. Medications  No. Follow up visit  No.  Do you have questions or concerns about your Care? No.  Actions: * If pain score is 4 or above: No action needed, pain <4.

## 2017-10-06 ENCOUNTER — Other Ambulatory Visit: Payer: Self-pay

## 2017-10-06 MED ORDER — BIS SUBCIT-METRONID-TETRACYC 140-125-125 MG PO CAPS: 3.0000 | ORAL_CAPSULE | Freq: Three times a day (TID) | ORAL | 0 refills | Status: DC

## 2017-10-12 ENCOUNTER — Telehealth: Payer: Self-pay | Admitting: Internal Medicine

## 2017-10-12 MED ORDER — PANTOPRAZOLE SODIUM 40 MG PO TBEC: 40.0000 mg | DELAYED_RELEASE_TABLET | Freq: Two times a day (BID) | ORAL | 0 refills | Status: DC

## 2017-10-12 NOTE — Telephone Encounter (Signed)
Pt does not know why she was prescribed pylera. Would like a call.

## 2017-10-12 NOTE — Telephone Encounter (Signed)
Result letter reviewed with pt over the phone and she is aware.

## 2017-10-21 ENCOUNTER — Other Ambulatory Visit: Payer: Self-pay | Admitting: Family Medicine

## 2017-10-21 DIAGNOSIS — I10 Essential (primary) hypertension: Secondary | ICD-10-CM

## 2017-10-22 ENCOUNTER — Other Ambulatory Visit: Payer: Self-pay | Admitting: Gastroenterology

## 2017-10-22 DIAGNOSIS — K219 Gastro-esophageal reflux disease without esophagitis: Secondary | ICD-10-CM

## 2017-12-02 ENCOUNTER — Ambulatory Visit: Payer: BLUE CROSS/BLUE SHIELD | Admitting: Family Medicine

## 2017-12-17 ENCOUNTER — Encounter: Payer: Self-pay | Admitting: Family Medicine

## 2017-12-30 ENCOUNTER — Ambulatory Visit (INDEPENDENT_AMBULATORY_CARE_PROVIDER_SITE_OTHER): Payer: BLUE CROSS/BLUE SHIELD | Admitting: Allergy

## 2017-12-30 ENCOUNTER — Other Ambulatory Visit: Payer: Self-pay | Admitting: *Deleted

## 2017-12-30 ENCOUNTER — Encounter: Payer: Self-pay | Admitting: Allergy

## 2017-12-30 VITALS — BP 132/80 | HR 84 | Temp 98.3°F | Ht 62.0 in | Wt 174.0 lb

## 2017-12-30 DIAGNOSIS — J4540 Moderate persistent asthma, uncomplicated: Secondary | ICD-10-CM | POA: Diagnosis not present

## 2017-12-30 DIAGNOSIS — H1013 Acute atopic conjunctivitis, bilateral: Secondary | ICD-10-CM

## 2017-12-30 DIAGNOSIS — J3089 Other allergic rhinitis: Secondary | ICD-10-CM | POA: Diagnosis not present

## 2017-12-30 DIAGNOSIS — T7800XD Anaphylactic reaction due to unspecified food, subsequent encounter: Secondary | ICD-10-CM | POA: Diagnosis not present

## 2017-12-30 DIAGNOSIS — Z889 Allergy status to unspecified drugs, medicaments and biological substances status: Secondary | ICD-10-CM

## 2017-12-30 MED ORDER — ALBUTEROL SULFATE HFA 108 (90 BASE) MCG/ACT IN AERS: 2.0000 | INHALATION_SPRAY | RESPIRATORY_TRACT | 1 refills | Status: DC | PRN

## 2017-12-30 MED ORDER — MONTELUKAST SODIUM 10 MG PO TABS: 10.0000 mg | ORAL_TABLET | Freq: Every day | ORAL | 5 refills | Status: DC

## 2017-12-30 MED ORDER — AZELASTINE-FLUTICASONE 137-50 MCG/ACT NA SUSP: 1.0000 | Freq: Two times a day (BID) | NASAL | 5 refills | Status: DC

## 2017-12-30 MED ORDER — EPINEPHRINE 0.3 MG/0.3ML IJ SOAJ: INTRAMUSCULAR | 2 refills | Status: DC

## 2017-12-30 NOTE — Patient Instructions (Addendum)
Allergies    - environmental allergy skin testing today is positive to grasses, weeds, dust mites, dog, cockroach, cat, horse, mouse, molds   - allergen avoidance measures discussed/handouts provided   - start Singulair 10mg  daily -- take at bedtime   - would recommend taking daily long-acting antihistamine like Zyrtec 10mg , Xyzal 5mg  or Allegra 180mg  daily (generic also fine to use)   - for nasal congestion and drainage recommend trial dymista 1 spray each nostril twice a day.  This is a combination nasal spray with Flonase + Astelin (nasal antihistamine).  This helps with both nasal congestion and drainage.  If not covered by insurance then will prescribe Astelin separately.    - for itchy/watery/red eyes can trial OTC Alaway or Zaditor 1 drop each eye up to twice a day as needed  - allergen immunotherapy discussed today including protocol, benefits and risk.  Informational handout provided.  If interested in this therapuetic option you can check with your insurance carrier for coverage.  Let us know if you would like to proceed with this option.    Allergic asthma   - you have symptoms consistent with asthma including cough and wheezing that is improved with use of albuterol.  It is mostly like driven by your allergies.    - have access to albuterol inhaler 2 puffs every 4-6 hours as needed for cough/wheeze/shortness of breath/chest tightness.  May use 15-20 minutes prior to activity.   Monitor frequency of use.     - start singulair as above  Food allergy   - skin testing to shellfish is positive to shrimp, crab, lobster, oyster   - continue avoidance of shellfish   - have access to self-injectable epinephrine AuviQ 0.3mg  at all times   - follow emergency action plan in case of allergic reaction  Follow-up 3-4 months or sooner if needed

## 2017-12-30 NOTE — Progress Notes (Signed)
New Patient Note  RE: Samantha Monroe MRN: [id number] DOB: 05/25/1975 Date of Office Visit: 12/30/2017  Referring provider: Shelda Pal* Primary care provider: Shelda Pal, DO  Chief Complaint: allergies  History of present illness: Samantha Monroe is a 42 y.o. female presenting today for consultation for allergies.   She reports having allergy symptoms of sneezing in the mornings, nasal drainage with throat clearing throughout the day, nasal congestion with pressure, itchy eyes.  She has had these symptoms all throughout the year but is worse during spring and winter.  She takes benadryl at night but does make her sleepy.  She has tried OTC allergy medications but states they are too expensive.  Has not tried nasal sprays or eye drops.    She does report a dry cough that is very occasional.  She has had wheezing with the cough. She states she does have an inhaler that she got from a friend as the friend has heard her wheezing and will use in the morning as she does note wheezing in the mornings when she is readying.  It does help with relieve symptoms.   She does report having episodes of bronchitis several years ago.  She denies having childhood asthma.      With shellfish she has had throat swelling, difficulty breathing and eye swelling.  She recalls having reaction around 42 years old with shrimp ingestion.  She is able to fish without issue.  She also reports having throat swelling, difficulty breathing and eye swelling with aspiring Psychologist, occupational) thus she avoids all NSAIDs.  No history of eczema.    Review of systems: Review of Systems  Constitutional: Negative for chills, fever and malaise/fatigue.  HENT: Positive for congestion. Negative for ear discharge, ear pain, nosebleeds, sinus pain and sore throat.   Eyes: Negative for pain, discharge and redness.  Respiratory: Positive for wheezing. Negative for cough, sputum production and shortness of breath.     Cardiovascular: Negative for chest pain.  Gastrointestinal: Negative for abdominal pain, constipation, diarrhea, heartburn, nausea and vomiting.  Musculoskeletal: Negative for joint pain.  Skin: Negative for itching and rash.  Neurological: Negative for headaches.    All other systems negative unless noted above in HPI  Past medical history: Past Medical History:  Diagnosis Date  . Allergy   . Anxiety   . Asthma   . Depression   . Dyslipidemia (high LDL; low HDL) 10/12/2015  . Easy bruising 10/26/2015  . Essential hypertension 10/12/2015  . GERD (gastroesophageal reflux disease)   . Heart murmur   . High cholesterol   . High triglycerides   . Hypertension   . Migraines   . Obesity 10/12/2015  . Seizures ([place])   . Sleep apnea     Past surgical history: Past Surgical History:  Procedure Laterality Date  . APPENDECTOMY    . CESAREAN SECTION     3 C sections  . DILITATION & CURRETTAGE/HYSTROSCOPY WITH THERMACHOICE ABLATION  01/09/2012   Procedure: DILATATION & CURETTAGE/HYSTEROSCOPY WITH THERMACHOICE ABLATION;  Surgeon: Cyril Mourning, MD;  Location: [facility];  Service: Gynecology;  Laterality: N/A;  . TUBAL LIGATION      Family history:  Family History  Problem Relation Age of Onset  . Hypertension Mother   . Diabetes Mother        type II  . Colon polyps Mother   . High Cholesterol Unknown   . Hyperlipidemia Brother   . Hyperlipidemia Maternal Grandmother   .  Hyperlipidemia Maternal Grandfather   . Asthma Son   . Eczema Son   . Urticaria Son   . Colon cancer Neg Hx   . Esophageal cancer Neg Hx   . Rectal cancer Neg Hx   . Stomach cancer Neg Hx     Social history: Lives in apartment with carpeting with electric heating and central cooling.  She has a dog in the home.  There is no concern for water damage, mildew or roaches in the home.  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  She denies smoking history.   Medication List: Allergies as of  12/30/2017      Reactions   Aspirin Other (See Comments)   Eye redness, felt like throat was closing.   Motrin [ibuprofen]    Felt like throat was closing   Shellfish Allergy Other (See Comments)   Felt like throat was closing      Medication List        Accurate as of 12/30/17  4:48 PM. Always use your most recent med list.          amLODipine 5 MG tablet Commonly known as:  NORVASC TAKE 1 TABLET (5 MG TOTAL) BY MOUTH DAILY.   bismuth-metronidazole-tetracycline 140-125-125 MG capsule Commonly known as:  PYLERA Take 3 capsules by mouth 4 (four) times daily -  before meals and at bedtime.   escitalopram 10 MG tablet Commonly known as:  LEXAPRO Take 10 mg by mouth daily.   pantoprazole 40 MG tablet Commonly known as:  PROTONIX Take 1 tablet (40 mg total) by mouth 2 (two) times daily.   pantoprazole 40 MG tablet Commonly known as:  PROTONIX TAKE 1 TABLET BY MOUTH TWICE A DAY BEFORE A MEAL   triamterene-hydrochlorothiazide 75-50 MG tablet Commonly known as:  MAXZIDE Take 1 tablet by mouth daily.   zolpidem 10 MG tablet Commonly known as:  AMBIEN Take 10 mg by mouth at bedtime as needed. For insomina       Known medication allergies: Allergies  Allergen Reactions  . Aspirin Other (See Comments)    Eye redness, felt like throat was closing.  . Motrin [Ibuprofen]     Felt like throat was closing  . Shellfish Allergy Other (See Comments)    Felt like throat was closing     Physical examination: Blood pressure 132/80, pulse 84, temperature 98.3 F (36.8 C), temperature source Oral, height 5\' 2"  (1.575 m), weight 174 lb (78.9 kg).  General: Alert, interactive, in no acute distress. HEENT: PERRLA, TMs pearly gray, turbinates moderately edematous with clear discharge, post-pharynx non erythematous. Neck: Supple without lymphadenopathy. Lungs: Clear to auscultation without wheezing, rhonchi or rales. {no increased work of breathing. CV: Normal S1, S2 without  murmurs. Abdomen: Nondistended, nontender. Skin: Warm and dry, without lesions or rashes. Extremities:  No clubbing, cyanosis or edema. Neuro:   Grossly intact.  Diagnositics/Labs:  Spirometry: FEV1: 2.55L 93%, FVC: 3.22L 97%, ratio consistent with nonobstrutive pattern  Allergy testing: environmental allergy skin prick testing is positive to grasses, weeds, mold, dust mites, dog, horse, cockroach, mouse Intradermal testing is positive to cat Skin prick testing to shellfish panel is positive to shrimp, crab, lobster, oyster Allergy testing results were read and interpreted by provider, documented by clinical staff.   Assessment and plan:   Allergic rhinitis with conjunctivitis   - environmental allergy skin testing today is positive to grasses, weeds, dust mites, dog, cockroach, cat, horse, mouse, molds   - allergen  avoidance measures discussed/handouts provided   - start Singulair 10mg  daily -- take at bedtime   - would recommend taking daily long-acting antihistamine like Zyrtec 10mg , Xyzal 5mg  or Allegra 180mg  daily (generic also fine to use)   - for nasal congestion and drainage recommend trial dymista 1 spray each nostril twice a day.  This is a combination nasal spray with Flonase + Astelin (nasal antihistamine).  This helps with both nasal congestion and drainage.  If not covered by insurance then will prescribe Astelin separately.    - for itchy/watery/red eyes can trial OTC Alaway or Zaditor 1 drop each eye up to twice a day as needed  - allergen immunotherapy discussed today including protocol, benefits and risk.  Informational handout provided.  If interested in this therapuetic option you can check with your insurance carrier for coverage.  Let us know if you would like to proceed with this option.    Allergic asthma   - you have symptoms consistent with asthma including cough and wheezing that is improved with use of albuterol.  It is mostly like driven by your allergies.     - have access to albuterol inhaler 2 puffs every 4-6 hours as needed for cough/wheeze/shortness of breath/chest tightness.  May use 15-20 minutes prior to activity.   Monitor frequency of use.     - start singulair as above  Food allergy   - skin testing to shellfish is positive to shrimp, crab, lobster, oyster   - continue avoidance of shellfish   - have access to self-injectable epinephrine AuviQ 0.3mg  at all times   - follow emergency action plan in case of allergic reaction  Drug allergy   - continue avoidance of NSAIDs at this time  Follow-up 3-4 months or sooner if needed  I appreciate the opportunity to take part in Chloey's care. Please do not hesitate to contact me with questions.  Sincerely,   Prudy Feeler, MD Allergy/Immunology [facility] [facility] [place]

## 2018-05-31 ENCOUNTER — Other Ambulatory Visit: Payer: Self-pay

## 2018-05-31 MED ORDER — MONTELUKAST SODIUM 10 MG PO TABS: 10.0000 mg | ORAL_TABLET | Freq: Every day | ORAL | 2 refills | Status: DC

## 2018-10-08 ENCOUNTER — Other Ambulatory Visit: Payer: Self-pay | Admitting: Allergy

## 2018-10-15 ENCOUNTER — Other Ambulatory Visit: Payer: Self-pay | Admitting: *Deleted

## 2018-10-24 ENCOUNTER — Other Ambulatory Visit: Payer: Self-pay | Admitting: Family Medicine

## 2018-10-24 DIAGNOSIS — I10 Essential (primary) hypertension: Secondary | ICD-10-CM

## 2018-10-30 ENCOUNTER — Other Ambulatory Visit: Payer: Self-pay | Admitting: Allergy

## 2018-11-03 ENCOUNTER — Other Ambulatory Visit: Payer: Self-pay

## 2018-11-03 ENCOUNTER — Encounter: Payer: Self-pay | Admitting: Allergy

## 2018-11-03 ENCOUNTER — Ambulatory Visit (INDEPENDENT_AMBULATORY_CARE_PROVIDER_SITE_OTHER): Payer: BC Managed Care – PPO | Admitting: Allergy

## 2018-11-03 VITALS — BP 110/78 | HR 74 | Temp 98.4°F | Resp 16 | Ht 62.0 in

## 2018-11-03 DIAGNOSIS — H1013 Acute atopic conjunctivitis, bilateral: Secondary | ICD-10-CM | POA: Diagnosis not present

## 2018-11-03 DIAGNOSIS — J4540 Moderate persistent asthma, uncomplicated: Secondary | ICD-10-CM | POA: Diagnosis not present

## 2018-11-03 DIAGNOSIS — J3089 Other allergic rhinitis: Secondary | ICD-10-CM | POA: Diagnosis not present

## 2018-11-03 DIAGNOSIS — Z889 Allergy status to unspecified drugs, medicaments and biological substances status: Secondary | ICD-10-CM

## 2018-11-03 DIAGNOSIS — T7800XD Anaphylactic reaction due to unspecified food, subsequent encounter: Secondary | ICD-10-CM | POA: Diagnosis not present

## 2018-11-03 MED ORDER — EPINEPHRINE 0.3 MG/0.3ML IJ SOAJ: 0.3000 mg | Freq: Once | INTRAMUSCULAR | 2 refills | Status: AC

## 2018-11-03 MED ORDER — ALBUTEROL SULFATE HFA 108 (90 BASE) MCG/ACT IN AERS: 2.0000 | INHALATION_SPRAY | RESPIRATORY_TRACT | 1 refills | Status: DC | PRN

## 2018-11-03 MED ORDER — MONTELUKAST SODIUM 10 MG PO TABS: 10.0000 mg | ORAL_TABLET | Freq: Every day | ORAL | 5 refills | Status: DC

## 2018-11-03 NOTE — Patient Instructions (Addendum)
Allergies    - continue avoidance measures for grasses, weeds, dust mites, dog, cockroach, cat, horse, mouse, molds   - continue Singulair 10mg  daily -- take at bedtime.   Will refill today.    - continue daily long-acting antihistamine like Zyrtec 10mg , Xyzal 5mg  or Allegra 180mg  daily (generic also fine to use)   - for nasal congestion and drainage continue dymista 1 spray each nostril twice a day.  This is a combination nasal spray with Flonase + Astelin (nasal antihistamine).  This helps with both nasal congestion and drainage.  If not covered by insurance then will prescribe Astelin separately.    - for itchy/watery/red eyes can use OTC Alaway or Zaditor 1 drop each eye up to twice a day as needed  - allergen immunotherapy has been previously discussed including protocol, benefits and risk.  If interested in this therapuetic option you can check with your insurance carrier for coverage.  Let us know if you would like to proceed with this option.    Allergic asthma   - have access to albuterol inhaler 2 puffs every 4-6 hours as needed for cough/wheeze/shortness of breath/chest tightness.  May use 15-20 minutes prior to activity.   Monitor frequency of use.     - continue singulair as above  Control goals:   Full participation in all desired activities (may need albuterol before activity)  Albuterol use two time or less a week on average (not counting use with activity)  Cough interfering with sleep two time or less a month  Oral steroids no more than once a year  No hospitalizations  Food allergy   - testing done at initial visit was positive to shrimp, crab, lobster, oyster   - continue avoidance of shellfish   - have access to self-injectable epinephrine AuviQ 0.3mg  at all times   - follow emergency action plan in case of allergic reaction  Drug allergy   - continue avoidance of NSAID medications  Follow-up 1 year or sooner if needed

## 2018-11-03 NOTE — Progress Notes (Signed)
Follow-up Note  RE: Samantha Monroe MRN: [id number] DOB: Nov 02, 1975 Date of Office Visit: 11/03/2018   History of present illness: Samantha Monroe is a 43 y.o. female presenting today for follow-up of allergic rhinitis with conjunctivitis, allergic asthma and food allergy.  She was last seen in the office on 12/30/17 by myself.  She states she has not had any major health changes, surgeries or hospitalizations since last visit.  She state she ran out of Singulair about 2 weeks ago and can really tell a difference in her allergy symptoms and asthma symptoms.  She states she has been having more chest tightness and wheezing in the mornings that is relieved with albuterol use.  Denies nighttime awakenings.  No ED/UC visits or systemic steroid needs.   She also states she has had more sneezing off singulair.  She states she sometimes will take benadryl at night to help with allergy symptoms.  She states she has not used any longer acting antihistamines like allegra or zyrtec.  She also states she found her dymista bottle but states she hasn't really had much congestion or drainage to warrant use of the nose spray.  She has not needed to use any eye drops thus far.    She continues to avoid shellfish without any accidental ingestions or needed to use her auviq device.    She also continues to avoid NSAID medications.    Review of systems: Review of Systems  Constitutional: Negative for chills, fever and malaise/fatigue.  HENT: Negative for congestion, ear discharge, nosebleeds, sinus pain and sore throat.   Eyes: Negative for pain, discharge and redness.  Respiratory: Positive for wheezing. Negative for cough, sputum production and shortness of breath.   Cardiovascular: Negative.   Gastrointestinal: Negative for abdominal pain, constipation, diarrhea, heartburn, nausea and vomiting.  Musculoskeletal: Negative for joint pain.  Skin: Negative for itching and rash.  Neurological: Negative for  headaches.    All other systems negative unless noted above in HPI  Past medical/social/surgical/family history have been reviewed and are unchanged unless specifically indicated below.  No changes  Medication List: Allergies as of 11/03/2018      Reactions   Aspirin Other (See Comments)   Eye redness, felt like throat was closing.   Motrin [ibuprofen]    Felt like throat was closing   Shellfish Allergy Other (See Comments)   Felt like throat was closing      Medication List       Accurate as of November 03, 2018  4:00 PM. If you have any questions, ask your nurse or doctor.        albuterol 108 (90 Base) MCG/ACT inhaler Commonly known as: ProAir HFA Inhale 2 puffs into the lungs every 4 (four) hours as needed for wheezing or shortness of breath.   amLODipine 5 MG tablet Commonly known as: NORVASC TAKE 1 TABLET (5 MG TOTAL) BY MOUTH DAILY.   Azelastine-Fluticasone 137-50 MCG/ACT Susp Commonly known as: Dymista Place 1 spray into both nostrils 2 (two) times daily.   bismuth-metronidazole-tetracycline 140-125-125 MG capsule Commonly known as: PYLERA Take 3 capsules by mouth 4 (four) times daily -  before meals and at bedtime.   EPINEPHrine 0.3 mg/0.3 mL Soaj injection Commonly known as: Auvi-Q Use as directed for severe allergic reaction   escitalopram 10 MG tablet Commonly known as: LEXAPRO Take 10 mg by mouth daily.   montelukast 10 MG tablet Commonly known as: SINGULAIR Take 1 tablet (10 mg total) by mouth  at bedtime.   pantoprazole 40 MG tablet Commonly known as: PROTONIX Take 1 tablet (40 mg total) by mouth 2 (two) times daily.   pantoprazole 40 MG tablet Commonly known as: PROTONIX TAKE 1 TABLET BY MOUTH TWICE A DAY BEFORE A MEAL   triamterene-hydrochlorothiazide 75-50 MG tablet Commonly known as: MAXZIDE Take 1 tablet by mouth daily.   zolpidem 10 MG tablet Commonly known as: AMBIEN Take 10 mg by mouth at bedtime as needed. For insomina        Known medication allergies: Allergies  Allergen Reactions  . Aspirin Other (See Comments)    Eye redness, felt like throat was closing.  . Motrin [Ibuprofen]     Felt like throat was closing  . Shellfish Allergy Other (See Comments)    Felt like throat was closing     Physical examination: Blood pressure 110/78, pulse 74, temperature 98.4 F (36.9 C), temperature source Temporal, resp. rate 16, height 5\' 2"  (1.575 m), SpO2 97 %.  General: Alert, interactive, in no acute distress. HEENT: PERRLA, TMs pearly gray, turbinates mildly edematous without discharge, post-pharynx non erythematous. Neck: Supple without lymphadenopathy. Lungs: Clear to auscultation without wheezing, rhonchi or rales. {no increased work of breathing. CV: Normal S1, S2 without murmurs. Abdomen: Nondistended, nontender. Skin: Warm and dry, without lesions or rashes. Extremities:  No clubbing, cyanosis or edema. Neuro:   Grossly intact.  Diagnositics/Labs:  Spirometry: FEV1: 2.41L 108%, FVC: 3.59L 131%, ratio consistent with nonobstructive pattern   Assessment and plan:   Allergic rhinitis with conjunctivitis   - continue avoidance measures for grasses, weeds, dust mites, dog, cockroach, cat, horse, mouse, molds   - continue Singulair 10mg  daily -- take at bedtime.   Will refill today.    - continue daily long-acting antihistamine like Zyrtec 10mg , Xyzal 5mg  or Allegra 180mg  daily (generic also fine to use)   - for nasal congestion and drainage continue dymista 1 spray each nostril twice a day.  This is a combination nasal spray with Flonase + Astelin (nasal antihistamine).  This helps with both nasal congestion and drainage.  If not covered by insurance then will prescribe Astelin separately.    - for itchy/watery/red eyes can use OTC Alaway or Zaditor 1 drop each eye up to twice a day as needed  - allergen immunotherapy has been previously discussed including protocol, benefits and risk.  If interested  in this therapuetic option you can check with your insurance carrier for coverage.  Let us know if you would like to proceed with this option.    Allergic asthma   - have access to albuterol inhaler 2 puffs every 4-6 hours as needed for cough/wheeze/shortness of breath/chest tightness.  May use 15-20 minutes prior to activity.   Monitor frequency of use.     - continue singulair as above  Control goals:   Full participation in all desired activities (may need albuterol before activity)  Albuterol use two time or less a week on average (not counting use with activity)  Cough interfering with sleep two time or less a month  Oral steroids no more than once a year  No hospitalizations  Food allergy   - testing done at initial visit was positive to shrimp, crab, lobster, oyster   - continue avoidance of shellfish   - have access to self-injectable epinephrine AuviQ 0.3mg  at all times   - follow emergency action plan in case of allergic reaction  Drug allergy   - continue avoidance of NSAID medications  Follow-up 1 year or sooner if needed  I appreciate the opportunity to take part in Aracelli's care. Please do not hesitate to contact me with questions.  Sincerely,   Prudy Feeler, MD Allergy/Immunology [facility] [place]

## 2018-12-08 DIAGNOSIS — Z01419 Encounter for gynecological examination (general) (routine) without abnormal findings: Secondary | ICD-10-CM | POA: Diagnosis not present

## 2018-12-08 DIAGNOSIS — Z1231 Encounter for screening mammogram for malignant neoplasm of breast: Secondary | ICD-10-CM | POA: Diagnosis not present

## 2018-12-08 DIAGNOSIS — Z6829 Body mass index (BMI) 29.0-29.9, adult: Secondary | ICD-10-CM | POA: Diagnosis not present

## 2018-12-13 ENCOUNTER — Other Ambulatory Visit: Payer: Self-pay | Admitting: Obstetrics and Gynecology

## 2018-12-13 DIAGNOSIS — R928 Other abnormal and inconclusive findings on diagnostic imaging of breast: Secondary | ICD-10-CM

## 2018-12-15 ENCOUNTER — Other Ambulatory Visit: Payer: Self-pay

## 2018-12-15 ENCOUNTER — Ambulatory Visit
Admission: RE | Admit: 2018-12-15 | Discharge: 2018-12-15 | Disposition: A | Discharge status: Home / Self Care | Payer: BLUE CROSS/BLUE SHIELD | Source: Ambulatory Visit | Attending: Obstetrics and Gynecology | Admitting: Obstetrics and Gynecology

## 2018-12-15 DIAGNOSIS — N6002 Solitary cyst of left breast: Secondary | ICD-10-CM | POA: Diagnosis not present

## 2018-12-15 DIAGNOSIS — R928 Other abnormal and inconclusive findings on diagnostic imaging of breast: Secondary | ICD-10-CM

## 2019-01-17 ENCOUNTER — Encounter: Payer: Self-pay | Admitting: Family Medicine

## 2019-01-17 ENCOUNTER — Other Ambulatory Visit: Payer: Self-pay

## 2019-01-17 ENCOUNTER — Other Ambulatory Visit: Payer: Self-pay | Admitting: Family Medicine

## 2019-01-17 ENCOUNTER — Ambulatory Visit: Payer: BC Managed Care – PPO | Admitting: Family Medicine

## 2019-01-17 VITALS — BP 132/84 | HR 88 | Temp 97.4°F | Ht 62.0 in | Wt 160.1 lb

## 2019-01-17 DIAGNOSIS — L989 Disorder of the skin and subcutaneous tissue, unspecified: Secondary | ICD-10-CM

## 2019-01-17 DIAGNOSIS — E785 Hyperlipidemia, unspecified: Secondary | ICD-10-CM

## 2019-01-17 DIAGNOSIS — I10 Essential (primary) hypertension: Secondary | ICD-10-CM

## 2019-01-17 DIAGNOSIS — Z0000 Encounter for general adult medical examination without abnormal findings: Secondary | ICD-10-CM

## 2019-01-17 LAB — LIPID PANEL
Cholesterol: 317 mg/dL — ABNORMAL HIGH (ref 0–200)
HDL: 48.4 mg/dL (ref >39.00)
LDL Cholesterol: 240 mg/dL — ABNORMAL HIGH (ref 0–99)
NonHDL: 268.68
Total CHOL/HDL Ratio: 7
Triglycerides: 143 mg/dL (ref 0.0–149.0)
VLDL: 28.6 mg/dL (ref 0.0–40.0)

## 2019-01-17 LAB — CBC
HCT: 35.1 % — ABNORMAL LOW (ref 36.0–46.0)
Hemoglobin: 11.2 g/dL — ABNORMAL LOW (ref 12.0–15.0)
MCHC: 32 g/dL (ref 30.0–36.0)
MCV: 80.1 fl (ref 78.0–100.0)
Platelets: 307 10*3/uL (ref 150.0–400.0)
RBC: 4.38 Mil/uL (ref 3.87–5.11)
RDW: 16.5 % — ABNORMAL HIGH (ref 11.5–15.5)
WBC: 6 10*3/uL (ref 4.0–10.5)

## 2019-01-17 LAB — COMPREHENSIVE METABOLIC PANEL
ALT: 10 U/L (ref 0–35)
AST: 17 U/L (ref 0–37)
Albumin: 4 g/dL (ref 3.5–5.2)
Alkaline Phosphatase: 69 U/L (ref 39–117)
BUN: 9 mg/dL (ref 6–23)
CO2: 25 mEq/L (ref 19–32)
Calcium: 9.4 mg/dL (ref 8.4–10.5)
Chloride: 99 mEq/L (ref 96–112)
Creatinine, Ser: 0.55 mg/dL (ref 0.40–1.20)
GFR: 144.49 mL/min (ref >60.00)
Glucose, Bld: 76 mg/dL (ref 70–99)
Potassium: 3.9 mEq/L (ref 3.5–5.1)
Sodium: 132 mEq/L — ABNORMAL LOW (ref 135–145)
Total Bilirubin: 0.4 mg/dL (ref 0.2–1.2)
Total Protein: 7.2 g/dL (ref 6.0–8.3)

## 2019-01-17 MED ORDER — AMLODIPINE BESYLATE 5 MG PO TABS: 5.0000 mg | ORAL_TABLET | Freq: Every day | ORAL | 3 refills | Status: DC

## 2019-01-17 NOTE — Progress Notes (Signed)
Chief Complaint  Patient presents with  . Follow-up     [name] is here for a complete physical.   Her last physical was >1 year ago.  Current diet: in general, a "healthy" diet. Current exercise: walking. Weight is decreasing intentionally and she denies daytime fatigue. No LMP recorded.  Seatbelt? Yes  Health Maintenance Pap/HPV- Yes Mammogram- Yes Tetanus- Yes HIV screening- Yes  Past Medical History:  Diagnosis Date  . Allergy   . Anxiety   . Asthma   . Depression   . Dyslipidemia (high LDL; low HDL) 10/12/2015  . Easy bruising 10/26/2015  . Essential hypertension 10/12/2015  . GERD (gastroesophageal reflux disease)   . Heart murmur   . High cholesterol   . High triglycerides   . Hypertension   . Migraines   . Obesity 10/12/2015  . Seizures ([place])   . Sleep apnea      Past Surgical History:  Procedure Laterality Date  . APPENDECTOMY    . CESAREAN SECTION     3 C sections  . DILITATION & CURRETTAGE/HYSTROSCOPY WITH THERMACHOICE ABLATION  01/09/2012   Procedure: DILATATION & CURETTAGE/HYSTEROSCOPY WITH THERMACHOICE ABLATION;  Surgeon: Cyril Mourning, MD;  Location: [facility];  Service: Gynecology;  Laterality: N/A;  . TUBAL LIGATION      Medications  Current Outpatient Medications on File Prior to Visit  Medication Sig Dispense Refill  . albuterol (VENTOLIN HFA) 108 (90 Base) MCG/ACT inhaler Inhale 2 puffs into the lungs every 4 (four) hours as needed for wheezing or shortness of breath. 18 g 1  . Azelastine-Fluticasone (DYMISTA) 137-50 MCG/ACT SUSP Place 1 spray into both nostrils 2 (two) times daily. 1 Bottle 5  . bismuth-metronidazole-tetracycline (PYLERA) 140-125-125 MG capsule Take 3 capsules by mouth 4 (four) times daily -  before meals and at bedtime. (Patient not taking: Reported on 11/03/2018) 120 capsule 0  . EPINEPHrine (AUVI-Q) 0.3 mg/0.3 mL IJ SOAJ injection Use as directed for severe allergic reaction 2 Device 2  . escitalopram  (LEXAPRO) 10 MG tablet Take 10 mg by mouth daily.    . montelukast (SINGULAIR) 10 MG tablet Take 1 tablet (10 mg total) by mouth at bedtime. 30 tablet 5  . pantoprazole (PROTONIX) 40 MG tablet TAKE 1 TABLET BY MOUTH TWICE A DAY BEFORE A MEAL 180 tablet 1  . zolpidem (AMBIEN) 10 MG tablet Take 10 mg by mouth at bedtime as needed. For insomina     Allergies Allergies  Allergen Reactions  . Aspirin Other (See Comments)    Eye redness, felt like throat was closing.  . Motrin [Ibuprofen]     Felt like throat was closing  . Shellfish Allergy Other (See Comments)    Felt like throat was closing   Review of Systems: Constitutional:  no unexpected weight changes Eye:  no recent significant change in vision Ear/Nose/Mouth/Throat:  Ears:  no recent change in hearing Nose/Mouth/Throat:  no complaints of nasal congestion, no sore throat Cardiovascular: no chest pain Respiratory:  no shortness of breath Gastrointestinal:  no abdominal pain, no change in bowel habits GU:  Female: negative for dysuria or pelvic pain Musculoskeletal/Extremities:  no pain of the joints Integumentary (Skin/Breast): +skin lesio near R eye; otherwise no abnormal skin lesions reported Neurologic:  no headaches Endocrine:  denies fatigue Hematologic/Lymphatic:  No areas of easy bleeding  Exam BP 132/84 (BP Location: Left Arm, Patient Position: Sitting, Cuff Size: Normal)   Pulse 88   Temp (!) 97.4 F (36.3  C) (Temporal)   Ht 5' 2"  (1.575 m)   Wt 160 lb 2 oz (72.6 kg)   SpO2 96%   BMI 29.29 kg/m  General:  well developed, well nourished, in no apparent distress Skin: Flesh colored and raised dome-shaped lesion on lateral R orbit without erythema, fluctuance, ttp, drainage or excessive warmth and measuring approx 4 mm in diameter; otherwise no significant moles, warts, or growths Head:  no masses, lesions, or tenderness Eyes:  pupils equal and round, sclera anicteric without injection Ears:  canals without  lesions, TMs shiny without retraction, no obvious effusion, no erythema Nose:  nares patent, septum midline, mucosa normal, and no drainage or sinus tenderness Throat/Pharynx:  lips and gingiva without lesion; tongue and uvula midline; non-inflamed pharynx; no exudates or postnasal drainage Neck: neck supple without adenopathy, thyromegaly, or masses Lungs:  clear to auscultation, breath sounds equal bilaterally, no respiratory distress Cardio:  regular rate and rhythm, no LE edema Abdomen:  abdomen soft, nontender; bowel sounds normal; no masses or organomegaly Genital: Defer to GYN Musculoskeletal:  symmetrical muscle groups noted without atrophy or deformity Extremities:  no clubbing, cyanosis, or edema, no deformities, no skin discoloration Neuro:  gait normal; deep tendon reflexes normal and symmetric Psych: well oriented with normal range of affect and appropriate judgment/insight  Assessment and Plan  Well adult exam - Plan: CBC, Comp Met (CMET), Lipid Profile  Essential hypertension - Plan: amLODipine (NORVASC) 5 MG tablet  Dyslipidemia (high LDL; low HDL)  Skin lesion - Plan: Ambulatory referral to Dermatology   Well 43 y.o. female. Counseled on diet and exercise. Other orders as above. Follow up in 6 mo or prn. The patient voiced understanding and agreement to the plan.  [place], DO 01/17/19 2:17 PM

## 2019-01-17 NOTE — Patient Instructions (Signed)
Give us 2-3 business days to get the results of your labs back.   Keep the diet clean and stay active.  Let us know if you need anything. 

## 2019-01-18 ENCOUNTER — Other Ambulatory Visit: Payer: Self-pay | Admitting: Family Medicine

## 2019-01-18 DIAGNOSIS — E7800 Pure hypercholesterolemia, unspecified: Secondary | ICD-10-CM

## 2019-01-18 MED ORDER — ROSUVASTATIN CALCIUM 20 MG PO TABS: 20.0000 mg | ORAL_TABLET | Freq: Every day | ORAL | 3 refills | Status: DC

## 2019-01-18 NOTE — Progress Notes (Signed)
Statin called in. Tubal ligation hx. F/u labs ordered.

## 2019-02-05 ENCOUNTER — Other Ambulatory Visit: Payer: Self-pay | Admitting: Allergy

## 2019-02-25 DIAGNOSIS — D2310 Other benign neoplasm of skin of unspecified eyelid, including canthus: Secondary | ICD-10-CM | POA: Diagnosis not present

## 2019-02-25 DIAGNOSIS — D485 Neoplasm of uncertain behavior of skin: Secondary | ICD-10-CM | POA: Diagnosis not present

## 2019-03-01 ENCOUNTER — Other Ambulatory Visit (INDEPENDENT_AMBULATORY_CARE_PROVIDER_SITE_OTHER): Payer: BC Managed Care – PPO

## 2019-03-01 ENCOUNTER — Other Ambulatory Visit: Payer: Self-pay

## 2019-03-01 DIAGNOSIS — E7800 Pure hypercholesterolemia, unspecified: Secondary | ICD-10-CM | POA: Diagnosis not present

## 2019-03-01 LAB — LIPID PANEL
Cholesterol: 159 mg/dL (ref 0–200)
HDL: 49.7 mg/dL (ref >39.00)
LDL Cholesterol: 94 mg/dL (ref 0–99)
NonHDL: 109.27
Total CHOL/HDL Ratio: 3
Triglycerides: 78 mg/dL (ref 0.0–149.0)
VLDL: 15.6 mg/dL (ref 0.0–40.0)

## 2019-03-17 DIAGNOSIS — J3503 Chronic tonsillitis and adenoiditis: Secondary | ICD-10-CM | POA: Diagnosis not present

## 2019-03-17 DIAGNOSIS — J353 Hypertrophy of tonsils with hypertrophy of adenoids: Secondary | ICD-10-CM | POA: Diagnosis not present

## 2019-04-29 ENCOUNTER — Other Ambulatory Visit: Payer: Self-pay | Admitting: Allergy

## 2019-04-29 DIAGNOSIS — Z01818 Encounter for other preprocedural examination: Secondary | ICD-10-CM | POA: Diagnosis not present

## 2019-05-05 ENCOUNTER — Other Ambulatory Visit (INDEPENDENT_AMBULATORY_CARE_PROVIDER_SITE_OTHER): Payer: Self-pay | Admitting: Otolaryngology

## 2019-05-05 DIAGNOSIS — J3503 Chronic tonsillitis and adenoiditis: Secondary | ICD-10-CM | POA: Diagnosis not present

## 2019-05-05 DIAGNOSIS — J353 Hypertrophy of tonsils with hypertrophy of adenoids: Secondary | ICD-10-CM | POA: Diagnosis not present

## 2019-05-05 DIAGNOSIS — J351 Hypertrophy of tonsils: Secondary | ICD-10-CM | POA: Diagnosis not present

## 2019-06-15 ENCOUNTER — Other Ambulatory Visit: Payer: Self-pay | Admitting: Allergy

## 2019-11-03 ENCOUNTER — Other Ambulatory Visit: Payer: Self-pay | Admitting: Allergy

## 2019-12-01 ENCOUNTER — Other Ambulatory Visit: Payer: Self-pay | Admitting: Allergy

## 2019-12-08 NOTE — Patient Instructions (Addendum)
Allergic rhinitis with conjunctivitis Continue avoidance measures to grass, weeds, dust mite, dog, cockroach, cat, horse, mouse, and molds Continue Singulair 10 mg once a day May take an over-the-counter antihistamine such as Claritin 10 mg, Xyzal 5 mg, Zyrtec 10 mg or Allegra 180 mg once a day as needed for runny nose Start Dymista nasal spray 1 spray each nostril twice a day as needed Continue over-the-counter Alaway or Zaditor eyedrops using 1 drop each eye twice a day as needed for itchy watery eyes Schedule an appointment in 2-3 weeks to start allergy injections.  Allergic asthma Start Flovent 110 mcg 2 puffs twice a day with spacer to help prevent cough and wheeze Continue Singulair as above Use albuterol 2 puffs every 4 hours as needed for cough, wheeze, tightness in chest, shortness of breath.  Also may use albuterol 2 puffs 5 to 15 minutes prior to exercise  Food allergy Avoid shellfish. In case of an allergic reaction, give Benadryl 4 teaspoonfuls every 4 hours, and if life-threatening symptoms occur, inject with AuviQ 0.3 mg.  Drug allergy Continue avoidance of NSAID medications  Please let us know if this treatment plan is not working well for you Schedule a follow-up appointment in 2 months

## 2019-12-09 ENCOUNTER — Encounter: Payer: Self-pay | Admitting: Family

## 2019-12-09 ENCOUNTER — Other Ambulatory Visit: Payer: Self-pay

## 2019-12-09 ENCOUNTER — Ambulatory Visit: Payer: BC Managed Care – PPO | Admitting: Family

## 2019-12-09 VITALS — BP 114/76 | HR 88 | Temp 98.3°F | Resp 16 | Ht 62.0 in | Wt 160.2 lb

## 2019-12-09 DIAGNOSIS — J3089 Other allergic rhinitis: Secondary | ICD-10-CM

## 2019-12-09 DIAGNOSIS — J302 Other seasonal allergic rhinitis: Secondary | ICD-10-CM

## 2019-12-09 DIAGNOSIS — Z6829 Body mass index (BMI) 29.0-29.9, adult: Secondary | ICD-10-CM | POA: Diagnosis not present

## 2019-12-09 DIAGNOSIS — D649 Anemia, unspecified: Secondary | ICD-10-CM | POA: Diagnosis not present

## 2019-12-09 DIAGNOSIS — Z1231 Encounter for screening mammogram for malignant neoplasm of breast: Secondary | ICD-10-CM | POA: Diagnosis not present

## 2019-12-09 DIAGNOSIS — T7800XD Anaphylactic reaction due to unspecified food, subsequent encounter: Secondary | ICD-10-CM | POA: Diagnosis not present

## 2019-12-09 DIAGNOSIS — J4540 Moderate persistent asthma, uncomplicated: Secondary | ICD-10-CM

## 2019-12-09 DIAGNOSIS — Z01419 Encounter for gynecological examination (general) (routine) without abnormal findings: Secondary | ICD-10-CM | POA: Diagnosis not present

## 2019-12-09 DIAGNOSIS — H1013 Acute atopic conjunctivitis, bilateral: Secondary | ICD-10-CM | POA: Diagnosis not present

## 2019-12-09 DIAGNOSIS — Z889 Allergy status to unspecified drugs, medicaments and biological substances status: Secondary | ICD-10-CM

## 2019-12-09 MED ORDER — EPINEPHRINE 0.3 MG/0.3ML IJ SOAJ
INTRAMUSCULAR | 1 refills | Status: DC
Start: 2019-12-09 — End: 2020-12-21

## 2019-12-09 MED ORDER — FLOVENT HFA 110 MCG/ACT IN AERO: INHALATION_SPRAY | RESPIRATORY_TRACT | 5 refills | Status: DC

## 2019-12-09 MED ORDER — MONTELUKAST SODIUM 10 MG PO TABS
ORAL_TABLET | ORAL | 5 refills | Status: DC
Start: 2019-12-09 — End: 2020-07-30

## 2019-12-09 NOTE — Progress Notes (Signed)
[place] [address] Dept: [phone]  FOLLOW UP NOTE  Patient ID: Samantha Monroe, female    DOB: 07-13-1975  Age: 44 y.o. MRN: [id number] Date of Office Visit: 12/09/2019  Assessment  Chief Complaint: Allergies (stuffy nose and ears)  HPI Samantha Monroe is a 44 year old female who presents today for follow-up of allergic rhinitis with conjunctivitis, allergic asthma, food allergy, and drug allergy.  She was last seen on November 03, 2018 by Dr. Nelva Bush.  Allergic rhinitis with conjunctivitis is reported as not well controlled with Singulair 10 mg once a day, Zyrtec 10 mg in the morning, and Benadryl at night.  She has forgotten about her Dymista nasal spray, but will start this back.  She reports for the past 2 to 3 weeks she has had nasal congestion and pressure in her ears.  She denies any pain, discharge, or fever or chills. She also reports clear rhinorrhea in the am, sneezing and post nasal drip. She is interested in starting allergy injections to help get better control of her allergy symptoms.  Allergic asthma is reported as not well controlled with Singulair 10 mg once a day and albuterol as needed.  She reports that for at least the past month she has been having a a lot of wheezing and a little bit of shortness of breath with rest.  She denies any coughing, tightness in her chest, and nocturnal awakenings.  Since her last office visit she has not required any systemic steroids and not made any trips to the emergency room or urgent care due to breathing problems.  She has been using her albuterol inhaler approximately 2-3 times a day this past month.  She continues to avoid shellfish without any accidental ingestion or use of her epinephrine autoinjector device.  She also continues to avoid NSAID medications.  Since her last office visit she has had a tonsillectomy in February of this year due to enlarged tonsils and sleep apnea.  Current medications  are as listed in the chart   Drug Allergies:  Allergies  Allergen Reactions  . Aspirin Other (See Comments)    Eye redness, felt like throat was closing.  . Motrin [Ibuprofen]     Felt like throat was closing  . Shellfish Allergy Other (See Comments)    Felt like throat was closing    Review of Systems: Review of Systems  Constitutional: Negative for chills and fever.  HENT:       Reports nasal congestion, clear rhinorrhea in the morning, and post nasal drip  Eyes:       Denies itchy watery eyes  Respiratory: Positive for shortness of breath and wheezing. Negative for cough.   Cardiovascular: Negative for chest pain and palpitations.  Gastrointestinal: Negative for abdominal pain and heartburn.  Genitourinary: Negative for dysuria.  Skin: Negative for itching and rash.  Neurological: Negative for headaches.  Endo/Heme/Allergies: Positive for environmental allergies.     Physical Exam: BP 114/76   Pulse 88   Temp 98.3 F (36.8 C) (Temporal)   Resp 16   Ht 5\' 2"  (1.575 m)   Wt 160 lb 3.2 oz (72.7 kg)   SpO2 98%   BMI 29.30 kg/m    Physical Exam Constitutional:      Appearance: Normal appearance.  HENT:     Head: Normocephalic and atraumatic.     Comments: Pharynx normal. Eyes normal. Ears normal- no erythema or bulging noted. Nose moderately edematous with no drainage noted  Right Ear: Tympanic membrane, ear canal and external ear normal.     Left Ear: Tympanic membrane and ear canal normal.     Mouth/Throat:     Mouth: Mucous membranes are moist.     Pharynx: Oropharynx is clear.  Eyes:     Conjunctiva/sclera: Conjunctivae normal.  Cardiovascular:     Rate and Rhythm: Regular rhythm.     Heart sounds: Normal heart sounds.  Pulmonary:     Effort: Pulmonary effort is normal.     Breath sounds: Normal breath sounds.     Comments: Lungs clear to auscultation Musculoskeletal:     Cervical back: Neck supple.  Skin:    General: Skin is warm.  Neurological:      Mental Status: She is alert and oriented to person, place, and time.  Psychiatric:        Mood and Affect: Mood normal.        Behavior: Behavior normal.        Thought Content: Thought content normal.        Judgment: Judgment normal.     Diagnostics: FVC 3.42 L, FEV1 2.51 L.  Predicted FVC 2.83 L, FEV1 2.31 L.  Spirometry indicates normal ventilatory function.  Assessment and Plan: 1. Moderate persistent extrinsic asthma without complication   2. Seasonal and perennial allergic rhinitis   3. Allergic conjunctivitis of both eyes   4. Anaphylactic shock due to food, subsequent encounter   5. Drug allergy     Meds ordered this encounter  Medications  . fluticasone (FLOVENT HFA) 110 MCG/ACT inhaler    Sig: Inhale 2 puffs twice a day with spacer to help prevent cough and wheeze.    Dispense:  1 each    Refill:  5  . EPINEPHrine (AUVI-Q) 0.3 mg/0.3 mL IJ SOAJ injection    Sig: Use as directed for severe allergic reaction    Dispense:  2 each    Refill:  1  . montelukast (SINGULAIR) 10 MG tablet    Sig: TAKE 1 TABLET BY MOUTH EVERYDAY AT BEDTIME    Dispense:  30 tablet    Refill:  5    Patient Instructions  Allergic rhinitis with conjunctivitis Continue avoidance measures to grass, weeds, dust mite, dog, cockroach, cat, horse, mouse, and molds Continue Singulair 10 mg once a day May take an over-the-counter antihistamine such as Claritin 10 mg, Xyzal 5 mg, Zyrtec 10 mg or Allegra 180 mg once a day as needed for runny nose Start Dymista nasal spray 1 spray each nostril twice a day as needed Continue over-the-counter Alaway or Zaditor eyedrops using 1 drop each eye twice a day as needed for itchy watery eyes Schedule an appointment in 2-3 weeks to start allergy injections.  Allergic asthma Start Flovent 110 mcg 2 puffs twice a day with spacer to help prevent cough and wheeze Continue Singulair as above Use albuterol 2 puffs every 4 hours as needed for cough, wheeze,  tightness in chest, shortness of breath.  Also may use albuterol 2 puffs 5 to 15 minutes prior to exercise  Food allergy Avoid shellfish. In case of an allergic reaction, give Benadryl 4 teaspoonfuls every 4 hours, and if life-threatening symptoms occur, inject with AuviQ 0.3 mg.  Drug allergy Continue avoidance of NSAID medications  Please let us know if this treatment plan is not working well for you Schedule a follow-up appointment in 2 months   Return in about 2 months (around 02/08/2020), or if symptoms worsen or fail  to improve.    Thank you for the opportunity to care for this patient.  Please do not hesitate to contact me with questions.  Althea Charon, FNP [facility] [place]

## 2019-12-13 DIAGNOSIS — J3081 Allergic rhinitis due to animal (cat) (dog) hair and dander: Secondary | ICD-10-CM | POA: Diagnosis not present

## 2019-12-13 NOTE — Addendum Note (Signed)
Addended by: Theresia Lo on: 12/13/2019 12:00 PM   Modules accepted: Orders

## 2019-12-13 NOTE — Progress Notes (Signed)
VIALS EXP 12-12-20

## 2019-12-14 ENCOUNTER — Other Ambulatory Visit: Payer: Self-pay | Admitting: Obstetrics and Gynecology

## 2019-12-14 DIAGNOSIS — J3089 Other allergic rhinitis: Secondary | ICD-10-CM | POA: Diagnosis not present

## 2019-12-14 DIAGNOSIS — R928 Other abnormal and inconclusive findings on diagnostic imaging of breast: Secondary | ICD-10-CM

## 2019-12-21 ENCOUNTER — Other Ambulatory Visit: Payer: Self-pay

## 2019-12-21 ENCOUNTER — Ambulatory Visit
Admission: RE | Admit: 2019-12-21 | Discharge: 2019-12-21 | Disposition: A | Discharge status: Home / Self Care | Payer: BC Managed Care – PPO | Source: Ambulatory Visit | Attending: Obstetrics and Gynecology | Admitting: Obstetrics and Gynecology

## 2019-12-21 DIAGNOSIS — R928 Other abnormal and inconclusive findings on diagnostic imaging of breast: Secondary | ICD-10-CM

## 2019-12-21 DIAGNOSIS — R922 Inconclusive mammogram: Secondary | ICD-10-CM | POA: Diagnosis not present

## 2019-12-21 DIAGNOSIS — N6012 Diffuse cystic mastopathy of left breast: Secondary | ICD-10-CM | POA: Diagnosis not present

## 2019-12-30 ENCOUNTER — Ambulatory Visit (INDEPENDENT_AMBULATORY_CARE_PROVIDER_SITE_OTHER): Payer: BC Managed Care – PPO

## 2019-12-30 ENCOUNTER — Other Ambulatory Visit: Payer: Self-pay

## 2019-12-30 DIAGNOSIS — J309 Allergic rhinitis, unspecified: Secondary | ICD-10-CM | POA: Diagnosis not present

## 2019-12-30 NOTE — Progress Notes (Signed)
Immunotherapy   Patient Details  Name: Samantha Monroe MRN: [id number] Date of Birth: 1975-03-14  12/30/2019  Shelia D Scaletta started injections for Mite, Albertina Parr, Mold and Pollen, Pet. Patient tolerated injection well and waited 30 minutes in the office.  Following schedule: B  Frequency:1 time per week Epi-Pen:Epi-Pen Available  Consent signed and patient instructions given.   Guy Franco 12/30/2019, 2:32 PM

## 2020-01-05 ENCOUNTER — Ambulatory Visit (INDEPENDENT_AMBULATORY_CARE_PROVIDER_SITE_OTHER): Payer: BC Managed Care – PPO | Admitting: *Deleted

## 2020-01-05 DIAGNOSIS — J309 Allergic rhinitis, unspecified: Secondary | ICD-10-CM | POA: Diagnosis not present

## 2020-01-10 ENCOUNTER — Ambulatory Visit (INDEPENDENT_AMBULATORY_CARE_PROVIDER_SITE_OTHER): Payer: BC Managed Care – PPO

## 2020-01-10 DIAGNOSIS — J309 Allergic rhinitis, unspecified: Secondary | ICD-10-CM

## 2020-01-17 ENCOUNTER — Ambulatory Visit (INDEPENDENT_AMBULATORY_CARE_PROVIDER_SITE_OTHER): Payer: BC Managed Care – PPO

## 2020-01-17 DIAGNOSIS — J309 Allergic rhinitis, unspecified: Secondary | ICD-10-CM

## 2020-01-18 ENCOUNTER — Other Ambulatory Visit: Payer: Self-pay | Admitting: Family Medicine

## 2020-01-18 DIAGNOSIS — I10 Essential (primary) hypertension: Secondary | ICD-10-CM

## 2020-01-24 ENCOUNTER — Ambulatory Visit (INDEPENDENT_AMBULATORY_CARE_PROVIDER_SITE_OTHER): Payer: BC Managed Care – PPO | Admitting: *Deleted

## 2020-01-24 DIAGNOSIS — J309 Allergic rhinitis, unspecified: Secondary | ICD-10-CM | POA: Diagnosis not present

## 2020-01-31 ENCOUNTER — Ambulatory Visit (INDEPENDENT_AMBULATORY_CARE_PROVIDER_SITE_OTHER): Payer: BC Managed Care – PPO

## 2020-01-31 DIAGNOSIS — J309 Allergic rhinitis, unspecified: Secondary | ICD-10-CM | POA: Diagnosis not present

## 2020-02-07 ENCOUNTER — Ambulatory Visit (INDEPENDENT_AMBULATORY_CARE_PROVIDER_SITE_OTHER): Payer: BC Managed Care – PPO | Admitting: *Deleted

## 2020-02-07 DIAGNOSIS — J309 Allergic rhinitis, unspecified: Secondary | ICD-10-CM

## 2020-02-08 ENCOUNTER — Ambulatory Visit: Payer: BC Managed Care – PPO | Admitting: Allergy

## 2020-02-14 ENCOUNTER — Ambulatory Visit (INDEPENDENT_AMBULATORY_CARE_PROVIDER_SITE_OTHER): Payer: BC Managed Care – PPO | Admitting: *Deleted

## 2020-02-14 DIAGNOSIS — J309 Allergic rhinitis, unspecified: Secondary | ICD-10-CM | POA: Diagnosis not present

## 2020-02-21 ENCOUNTER — Ambulatory Visit (INDEPENDENT_AMBULATORY_CARE_PROVIDER_SITE_OTHER): Payer: BC Managed Care – PPO

## 2020-02-21 DIAGNOSIS — J309 Allergic rhinitis, unspecified: Secondary | ICD-10-CM | POA: Diagnosis not present

## 2020-02-28 ENCOUNTER — Ambulatory Visit (INDEPENDENT_AMBULATORY_CARE_PROVIDER_SITE_OTHER): Payer: BC Managed Care – PPO

## 2020-02-28 DIAGNOSIS — J309 Allergic rhinitis, unspecified: Secondary | ICD-10-CM

## 2020-03-01 ENCOUNTER — Ambulatory Visit: Payer: BC Managed Care – PPO | Admitting: Allergy

## 2020-03-08 ENCOUNTER — Ambulatory Visit (INDEPENDENT_AMBULATORY_CARE_PROVIDER_SITE_OTHER): Payer: BC Managed Care – PPO

## 2020-03-08 DIAGNOSIS — J309 Allergic rhinitis, unspecified: Secondary | ICD-10-CM | POA: Diagnosis not present

## 2020-03-13 ENCOUNTER — Ambulatory Visit (INDEPENDENT_AMBULATORY_CARE_PROVIDER_SITE_OTHER): Payer: BC Managed Care – PPO

## 2020-03-13 DIAGNOSIS — J309 Allergic rhinitis, unspecified: Secondary | ICD-10-CM | POA: Diagnosis not present

## 2020-03-20 ENCOUNTER — Ambulatory Visit (INDEPENDENT_AMBULATORY_CARE_PROVIDER_SITE_OTHER): Payer: BC Managed Care – PPO

## 2020-03-20 DIAGNOSIS — J309 Allergic rhinitis, unspecified: Secondary | ICD-10-CM

## 2020-03-27 ENCOUNTER — Ambulatory Visit (INDEPENDENT_AMBULATORY_CARE_PROVIDER_SITE_OTHER): Payer: BC Managed Care – PPO | Admitting: *Deleted

## 2020-03-27 DIAGNOSIS — J309 Allergic rhinitis, unspecified: Secondary | ICD-10-CM

## 2020-04-03 ENCOUNTER — Ambulatory Visit (INDEPENDENT_AMBULATORY_CARE_PROVIDER_SITE_OTHER): Payer: BC Managed Care – PPO | Admitting: *Deleted

## 2020-04-03 DIAGNOSIS — J309 Allergic rhinitis, unspecified: Secondary | ICD-10-CM

## 2020-04-09 ENCOUNTER — Ambulatory Visit (INDEPENDENT_AMBULATORY_CARE_PROVIDER_SITE_OTHER): Payer: BC Managed Care – PPO

## 2020-04-09 DIAGNOSIS — J309 Allergic rhinitis, unspecified: Secondary | ICD-10-CM

## 2020-04-13 DIAGNOSIS — Z20822 Contact with and (suspected) exposure to covid-19: Secondary | ICD-10-CM | POA: Diagnosis not present

## 2020-04-13 DIAGNOSIS — Z03818 Encounter for observation for suspected exposure to other biological agents ruled out: Secondary | ICD-10-CM | POA: Diagnosis not present

## 2020-04-15 ENCOUNTER — Other Ambulatory Visit: Payer: Self-pay | Admitting: Family Medicine

## 2020-04-15 DIAGNOSIS — I10 Essential (primary) hypertension: Secondary | ICD-10-CM

## 2020-04-19 ENCOUNTER — Ambulatory Visit (INDEPENDENT_AMBULATORY_CARE_PROVIDER_SITE_OTHER): Payer: BC Managed Care – PPO

## 2020-04-19 DIAGNOSIS — J309 Allergic rhinitis, unspecified: Secondary | ICD-10-CM | POA: Diagnosis not present

## 2020-04-24 ENCOUNTER — Ambulatory Visit (INDEPENDENT_AMBULATORY_CARE_PROVIDER_SITE_OTHER): Payer: BC Managed Care – PPO

## 2020-04-24 DIAGNOSIS — J309 Allergic rhinitis, unspecified: Secondary | ICD-10-CM

## 2020-05-01 ENCOUNTER — Ambulatory Visit (INDEPENDENT_AMBULATORY_CARE_PROVIDER_SITE_OTHER): Payer: BC Managed Care – PPO | Admitting: *Deleted

## 2020-05-01 DIAGNOSIS — J309 Allergic rhinitis, unspecified: Secondary | ICD-10-CM | POA: Diagnosis not present

## 2020-05-07 ENCOUNTER — Other Ambulatory Visit: Payer: Self-pay | Admitting: Family Medicine

## 2020-05-07 ENCOUNTER — Ambulatory Visit (INDEPENDENT_AMBULATORY_CARE_PROVIDER_SITE_OTHER): Payer: BC Managed Care – PPO | Admitting: Family Medicine

## 2020-05-07 ENCOUNTER — Encounter: Payer: Self-pay | Admitting: Family Medicine

## 2020-05-07 ENCOUNTER — Other Ambulatory Visit: Payer: Self-pay

## 2020-05-07 VITALS — BP 112/68 | HR 72 | Temp 98.1°F | Ht 62.0 in | Wt 153.1 lb

## 2020-05-07 DIAGNOSIS — Z1159 Encounter for screening for other viral diseases: Secondary | ICD-10-CM

## 2020-05-07 DIAGNOSIS — Z0000 Encounter for general adult medical examination without abnormal findings: Secondary | ICD-10-CM | POA: Diagnosis not present

## 2020-05-07 LAB — LIPID PANEL
Cholesterol: 316 mg/dL — ABNORMAL HIGH (ref 0–200)
HDL: 59.6 mg/dL (ref >39.00)
LDL Cholesterol: 235 mg/dL — ABNORMAL HIGH (ref 0–99)
NonHDL: 256.28
Total CHOL/HDL Ratio: 5
Triglycerides: 108 mg/dL (ref 0.0–149.0)
VLDL: 21.6 mg/dL (ref 0.0–40.0)

## 2020-05-07 LAB — COMPREHENSIVE METABOLIC PANEL
ALT: 9 U/L (ref 0–35)
AST: 11 U/L (ref 0–37)
Albumin: 4.5 g/dL (ref 3.5–5.2)
Alkaline Phosphatase: 58 U/L (ref 39–117)
BUN: 12 mg/dL (ref 6–23)
CO2: 27 mEq/L (ref 19–32)
Calcium: 9.6 mg/dL (ref 8.4–10.5)
Chloride: 103 mEq/L (ref 96–112)
Creatinine, Ser: 0.57 mg/dL (ref 0.40–1.20)
GFR: 108.9 mL/min (ref >60.00)
Glucose, Bld: 84 mg/dL (ref 70–99)
Potassium: 4.3 mEq/L (ref 3.5–5.1)
Sodium: 137 mEq/L (ref 135–145)
Total Bilirubin: 0.4 mg/dL (ref 0.2–1.2)
Total Protein: 7.1 g/dL (ref 6.0–8.3)

## 2020-05-07 LAB — CBC
HCT: 39.9 % (ref 36.0–46.0)
Hemoglobin: 13.2 g/dL (ref 12.0–15.0)
MCHC: 33.1 g/dL (ref 30.0–36.0)
MCV: 84.3 fl (ref 78.0–100.0)
Platelets: 284 10*3/uL (ref 150.0–400.0)
RBC: 4.73 Mil/uL (ref 3.87–5.11)
RDW: 14.9 % (ref 11.5–15.5)
WBC: 5.3 10*3/uL (ref 4.0–10.5)

## 2020-05-07 MED ORDER — ROSUVASTATIN CALCIUM 20 MG PO TABS
20.0000 mg | ORAL_TABLET | Freq: Every day | ORAL | 3 refills | Status: DC
Start: 2020-05-07 — End: 2021-04-17

## 2020-05-07 NOTE — Patient Instructions (Signed)
Keep the diet clean and stay active.  Give us 2-3 business days to get the results of your labs back.   Let us know if you need anything.  

## 2020-05-07 NOTE — Progress Notes (Signed)
Chief Complaint  Patient presents with  . Annual Exam     [name] is here for a complete physical.   Her last physical was >1 year ago.  Current diet: in general, a "healthy" diet, intermittent fasting. Current exercise: walking. Weight is intentionally decreasing and she denies fatigue out of ordinary. Seatbelt? Yes Loss of interested in doing things or depression in past 2 weeks? No  Health Maintenance Pap/HPV- Yes Mammogram- Yes Tetanus- Yes Hep C screening- No HIV screening- Yes  Past Medical History:  Diagnosis Date  . Allergy   . Anxiety   . Asthma   . Depression   . Dyslipidemia (high LDL; low HDL) 10/12/2015  . Easy bruising 10/26/2015  . Essential hypertension 10/12/2015  . GERD (gastroesophageal reflux disease)   . Heart murmur   . High cholesterol   . High triglycerides   . Hypertension   . Migraines   . Obesity 10/12/2015  . Seizures ([place])   . Sleep apnea      Past Surgical History:  Procedure Laterality Date  . APPENDECTOMY    . CESAREAN SECTION     3 C sections  . DILITATION & CURRETTAGE/HYSTROSCOPY WITH THERMACHOICE ABLATION  01/09/2012   Procedure: DILATATION & CURETTAGE/HYSTEROSCOPY WITH THERMACHOICE ABLATION;  Surgeon: Cyril Mourning, MD;  Location: [facility];  Service: Gynecology;  Laterality: N/A;  . TUBAL LIGATION      Medications  Current Outpatient Medications on File Prior to Visit  Medication Sig Dispense Refill  . albuterol (VENTOLIN HFA) 108 (90 Base) MCG/ACT inhaler INHALE 2 PUFFS INTO THE LUNGS EVERY 4 HOURS AS NEEDED FOR WHEEZING/SHORTNESS OF BREATH 18 g 1  . Azelastine-Fluticasone 137-50 MCG/ACT SUSP PLACE 1 SPRAY INTO BOTH NOSTRILS 2 TIMES DAILY. 23 g 5  . EPINEPHrine (AUVI-Q) 0.3 mg/0.3 mL IJ SOAJ injection Use as directed for severe allergic reaction 2 each 1  . escitalopram (LEXAPRO) 10 MG tablet Take 10 mg by mouth daily.    . fluticasone (FLOVENT HFA) 110 MCG/ACT inhaler Inhale 2 puffs twice a day with spacer  to help prevent cough and wheeze. 1 each 5  . montelukast (SINGULAIR) 10 MG tablet TAKE 1 TABLET BY MOUTH EVERYDAY AT BEDTIME 30 tablet 5  . zolpidem (AMBIEN) 10 MG tablet Take 10 mg by mouth at bedtime as needed. For insomina     Allergies Allergies  Allergen Reactions  . Aspirin Other (See Comments)    Eye redness, felt like throat was closing.  . Motrin [Ibuprofen]     Felt like throat was closing  . Shellfish Allergy Other (See Comments)    Felt like throat was closing    Review of Systems: Constitutional:  no unexpected weight changes Eye:  no recent significant change in vision Ear/Nose/Mouth/Throat:  Ears:  no recent change in hearing Nose/Mouth/Throat:  no complaints of nasal congestion, no sore throat Cardiovascular: no chest pain Respiratory:  no shortness of breath Gastrointestinal:  no abdominal pain, no change in bowel habits GU:  Female: negative for dysuria or pelvic pain Musculoskeletal/Extremities:  no pain of the joints Integumentary (Skin/Breast):  no abnormal skin lesions reported Neurologic:  no headaches Endocrine:  denies fatigue Hematologic/Lymphatic:  No areas of easy bleeding  Exam BP 112/68 (BP Location: Left Arm, Patient Position: Sitting, Cuff Size: Normal)   Pulse 72   Temp 98.1 F (36.7 C) (Oral)   Ht 5\' 2"  (1.575 m)   Wt 153 lb 2 oz (69.5 kg)   SpO2 96%  BMI 28.01 kg/m  General:  well developed, well nourished, in no apparent distress Skin:  no significant moles, warts, or growths Head:  no masses, lesions, or tenderness Eyes:  pupils equal and round, sclera anicteric without injection Ears:  canals without lesions, TMs shiny without retraction, no obvious effusion, no erythema Nose:  nares patent, septum midline, mucosa normal, and no drainage or sinus tenderness Throat/Pharynx:  lips and gingiva without lesion; tongue and uvula midline; non-inflamed pharynx; no exudates or postnasal drainage Neck: neck supple without adenopathy,  thyromegaly, or masses Lungs:  clear to auscultation, breath sounds equal bilaterally, no respiratory distress Cardio:  regular rate and rhythm, no LE edema Abdomen:  abdomen soft, nontender; bowel sounds normal; no masses or organomegaly Genital: Defer to GYN Musculoskeletal:  symmetrical muscle groups noted without atrophy or deformity Extremities:  no clubbing, cyanosis, or edema, no deformities, no skin discoloration Neuro:  gait normal; deep tendon reflexes normal and symmetric Psych: well oriented with normal range of affect and appropriate judgment/insight  Assessment and Plan  Well adult exam - Plan: Comprehensive metabolic panel, Lipid panel, CBC  Encounter for hepatitis C screening test for low risk patient - Plan: Hepatitis C antibody   Well 45 y.o. female. Counseled on diet and exercise. Other orders as above. Doing really well, will stop statin and Norvasc for now.  Follow up in 1 yr pending above. The patient voiced understanding and agreement to the plan.  [place], DO 05/07/20 10:27 AM

## 2020-05-08 ENCOUNTER — Ambulatory Visit (INDEPENDENT_AMBULATORY_CARE_PROVIDER_SITE_OTHER): Payer: BC Managed Care – PPO | Admitting: *Deleted

## 2020-05-08 DIAGNOSIS — J309 Allergic rhinitis, unspecified: Secondary | ICD-10-CM

## 2020-05-08 LAB — HEPATITIS C ANTIBODY
Hepatitis C Ab: NONREACTIVE
SIGNAL TO CUT-OFF: 0.01 (ref <1.00)

## 2020-05-15 ENCOUNTER — Ambulatory Visit (INDEPENDENT_AMBULATORY_CARE_PROVIDER_SITE_OTHER): Payer: BC Managed Care – PPO | Admitting: *Deleted

## 2020-05-15 DIAGNOSIS — J309 Allergic rhinitis, unspecified: Secondary | ICD-10-CM

## 2020-05-22 ENCOUNTER — Ambulatory Visit (INDEPENDENT_AMBULATORY_CARE_PROVIDER_SITE_OTHER): Payer: BC Managed Care – PPO | Admitting: *Deleted

## 2020-05-22 DIAGNOSIS — J309 Allergic rhinitis, unspecified: Secondary | ICD-10-CM | POA: Diagnosis not present

## 2020-05-29 ENCOUNTER — Ambulatory Visit (INDEPENDENT_AMBULATORY_CARE_PROVIDER_SITE_OTHER): Payer: BC Managed Care – PPO | Admitting: *Deleted

## 2020-05-29 DIAGNOSIS — J309 Allergic rhinitis, unspecified: Secondary | ICD-10-CM

## 2020-06-07 ENCOUNTER — Ambulatory Visit (INDEPENDENT_AMBULATORY_CARE_PROVIDER_SITE_OTHER): Payer: BC Managed Care – PPO | Admitting: *Deleted

## 2020-06-07 DIAGNOSIS — J309 Allergic rhinitis, unspecified: Secondary | ICD-10-CM | POA: Diagnosis not present

## 2020-06-12 ENCOUNTER — Ambulatory Visit (INDEPENDENT_AMBULATORY_CARE_PROVIDER_SITE_OTHER): Payer: BC Managed Care – PPO | Admitting: *Deleted

## 2020-06-12 DIAGNOSIS — J309 Allergic rhinitis, unspecified: Secondary | ICD-10-CM

## 2020-06-19 ENCOUNTER — Ambulatory Visit (INDEPENDENT_AMBULATORY_CARE_PROVIDER_SITE_OTHER): Payer: BC Managed Care – PPO | Admitting: *Deleted

## 2020-06-19 DIAGNOSIS — J309 Allergic rhinitis, unspecified: Secondary | ICD-10-CM

## 2020-07-03 ENCOUNTER — Ambulatory Visit (INDEPENDENT_AMBULATORY_CARE_PROVIDER_SITE_OTHER): Payer: BC Managed Care – PPO | Admitting: *Deleted

## 2020-07-03 DIAGNOSIS — J309 Allergic rhinitis, unspecified: Secondary | ICD-10-CM

## 2020-07-08 ENCOUNTER — Other Ambulatory Visit: Payer: Self-pay | Admitting: Family

## 2020-07-09 NOTE — Telephone Encounter (Signed)
Noted! Thank you

## 2020-07-09 NOTE — Telephone Encounter (Signed)
Called patient and left a message for them to call back to schedule an appointment in order to receive a 30 day courtesy refill of montelukast.

## 2020-07-09 NOTE — Telephone Encounter (Signed)
Ok to send courtesy refill,but needs to schedule follow up appointment. Thank you!

## 2020-07-10 ENCOUNTER — Ambulatory Visit (INDEPENDENT_AMBULATORY_CARE_PROVIDER_SITE_OTHER): Payer: BC Managed Care – PPO | Admitting: *Deleted

## 2020-07-10 DIAGNOSIS — J309 Allergic rhinitis, unspecified: Secondary | ICD-10-CM

## 2020-07-11 DIAGNOSIS — J3081 Allergic rhinitis due to animal (cat) (dog) hair and dander: Secondary | ICD-10-CM | POA: Diagnosis not present

## 2020-07-11 NOTE — Progress Notes (Signed)
VIALS EXP 07-10-23

## 2020-07-17 ENCOUNTER — Ambulatory Visit (INDEPENDENT_AMBULATORY_CARE_PROVIDER_SITE_OTHER): Payer: BC Managed Care – PPO | Admitting: *Deleted

## 2020-07-17 DIAGNOSIS — J309 Allergic rhinitis, unspecified: Secondary | ICD-10-CM

## 2020-07-24 ENCOUNTER — Ambulatory Visit (INDEPENDENT_AMBULATORY_CARE_PROVIDER_SITE_OTHER): Payer: BC Managed Care – PPO | Admitting: *Deleted

## 2020-07-24 DIAGNOSIS — J309 Allergic rhinitis, unspecified: Secondary | ICD-10-CM

## 2020-07-31 ENCOUNTER — Ambulatory Visit (INDEPENDENT_AMBULATORY_CARE_PROVIDER_SITE_OTHER): Payer: BC Managed Care – PPO | Admitting: *Deleted

## 2020-07-31 DIAGNOSIS — J309 Allergic rhinitis, unspecified: Secondary | ICD-10-CM

## 2020-08-07 ENCOUNTER — Ambulatory Visit (INDEPENDENT_AMBULATORY_CARE_PROVIDER_SITE_OTHER): Payer: BC Managed Care – PPO | Admitting: *Deleted

## 2020-08-07 DIAGNOSIS — J309 Allergic rhinitis, unspecified: Secondary | ICD-10-CM

## 2020-08-14 ENCOUNTER — Ambulatory Visit (INDEPENDENT_AMBULATORY_CARE_PROVIDER_SITE_OTHER): Payer: BC Managed Care – PPO | Admitting: *Deleted

## 2020-08-14 DIAGNOSIS — J309 Allergic rhinitis, unspecified: Secondary | ICD-10-CM | POA: Diagnosis not present

## 2020-08-21 ENCOUNTER — Ambulatory Visit (INDEPENDENT_AMBULATORY_CARE_PROVIDER_SITE_OTHER): Payer: BC Managed Care – PPO | Admitting: *Deleted

## 2020-08-21 DIAGNOSIS — J309 Allergic rhinitis, unspecified: Secondary | ICD-10-CM | POA: Diagnosis not present

## 2020-08-28 ENCOUNTER — Ambulatory Visit (INDEPENDENT_AMBULATORY_CARE_PROVIDER_SITE_OTHER): Payer: BC Managed Care – PPO | Admitting: *Deleted

## 2020-08-28 DIAGNOSIS — J309 Allergic rhinitis, unspecified: Secondary | ICD-10-CM

## 2020-09-04 ENCOUNTER — Ambulatory Visit (INDEPENDENT_AMBULATORY_CARE_PROVIDER_SITE_OTHER): Payer: BC Managed Care – PPO

## 2020-09-04 DIAGNOSIS — J309 Allergic rhinitis, unspecified: Secondary | ICD-10-CM

## 2020-09-11 ENCOUNTER — Ambulatory Visit (INDEPENDENT_AMBULATORY_CARE_PROVIDER_SITE_OTHER): Payer: BC Managed Care – PPO | Admitting: *Deleted

## 2020-09-11 DIAGNOSIS — J309 Allergic rhinitis, unspecified: Secondary | ICD-10-CM

## 2020-09-18 ENCOUNTER — Ambulatory Visit (INDEPENDENT_AMBULATORY_CARE_PROVIDER_SITE_OTHER): Payer: BC Managed Care – PPO

## 2020-09-18 DIAGNOSIS — J309 Allergic rhinitis, unspecified: Secondary | ICD-10-CM | POA: Diagnosis not present

## 2020-09-24 ENCOUNTER — Ambulatory Visit (INDEPENDENT_AMBULATORY_CARE_PROVIDER_SITE_OTHER): Payer: BC Managed Care – PPO | Admitting: *Deleted

## 2020-09-24 DIAGNOSIS — J309 Allergic rhinitis, unspecified: Secondary | ICD-10-CM | POA: Diagnosis not present

## 2020-10-02 ENCOUNTER — Ambulatory Visit (INDEPENDENT_AMBULATORY_CARE_PROVIDER_SITE_OTHER): Payer: BC Managed Care – PPO | Admitting: *Deleted

## 2020-10-02 DIAGNOSIS — J309 Allergic rhinitis, unspecified: Secondary | ICD-10-CM | POA: Diagnosis not present

## 2020-10-09 ENCOUNTER — Ambulatory Visit (INDEPENDENT_AMBULATORY_CARE_PROVIDER_SITE_OTHER): Payer: BC Managed Care – PPO | Admitting: *Deleted

## 2020-10-09 DIAGNOSIS — J309 Allergic rhinitis, unspecified: Secondary | ICD-10-CM | POA: Diagnosis not present

## 2020-10-16 ENCOUNTER — Ambulatory Visit (INDEPENDENT_AMBULATORY_CARE_PROVIDER_SITE_OTHER): Payer: BC Managed Care – PPO | Admitting: *Deleted

## 2020-10-16 DIAGNOSIS — J309 Allergic rhinitis, unspecified: Secondary | ICD-10-CM | POA: Diagnosis not present

## 2020-10-23 ENCOUNTER — Ambulatory Visit (INDEPENDENT_AMBULATORY_CARE_PROVIDER_SITE_OTHER): Payer: BC Managed Care – PPO

## 2020-10-23 DIAGNOSIS — J309 Allergic rhinitis, unspecified: Secondary | ICD-10-CM | POA: Diagnosis not present

## 2020-10-29 NOTE — Progress Notes (Signed)
VIALS MADE. EXP 10-29-21

## 2020-10-30 ENCOUNTER — Ambulatory Visit (INDEPENDENT_AMBULATORY_CARE_PROVIDER_SITE_OTHER): Payer: BC Managed Care – PPO | Admitting: *Deleted

## 2020-10-30 DIAGNOSIS — J309 Allergic rhinitis, unspecified: Secondary | ICD-10-CM | POA: Diagnosis not present

## 2020-10-31 DIAGNOSIS — J3081 Allergic rhinitis due to animal (cat) (dog) hair and dander: Secondary | ICD-10-CM | POA: Diagnosis not present

## 2020-11-06 ENCOUNTER — Ambulatory Visit (INDEPENDENT_AMBULATORY_CARE_PROVIDER_SITE_OTHER): Payer: BC Managed Care – PPO | Admitting: *Deleted

## 2020-11-06 DIAGNOSIS — J309 Allergic rhinitis, unspecified: Secondary | ICD-10-CM | POA: Diagnosis not present

## 2020-11-12 ENCOUNTER — Ambulatory Visit: Payer: BC Managed Care – PPO | Admitting: Family Medicine

## 2020-11-12 ENCOUNTER — Other Ambulatory Visit: Payer: Self-pay

## 2020-11-12 ENCOUNTER — Encounter: Payer: Self-pay | Admitting: Family Medicine

## 2020-11-12 VITALS — BP 134/92 | HR 98 | Temp 98.7°F | Ht 62.0 in | Wt 162.0 lb

## 2020-11-12 DIAGNOSIS — E785 Hyperlipidemia, unspecified: Secondary | ICD-10-CM

## 2020-11-12 DIAGNOSIS — I10 Essential (primary) hypertension: Secondary | ICD-10-CM

## 2020-11-12 LAB — COMPREHENSIVE METABOLIC PANEL
ALT: 9 U/L (ref 0–35)
AST: 11 U/L (ref 0–37)
Albumin: 4.6 g/dL (ref 3.5–5.2)
Alkaline Phosphatase: 55 U/L (ref 39–117)
BUN: 14 mg/dL (ref 6–23)
CO2: 25 mEq/L (ref 19–32)
Calcium: 9.5 mg/dL (ref 8.4–10.5)
Chloride: 103 mEq/L (ref 96–112)
Creatinine, Ser: 0.68 mg/dL (ref 0.40–1.20)
GFR: 103.99 mL/min (ref >60.00)
Glucose, Bld: 79 mg/dL (ref 70–99)
Potassium: 4.3 mEq/L (ref 3.5–5.1)
Sodium: 135 mEq/L (ref 135–145)
Total Bilirubin: 0.3 mg/dL (ref 0.2–1.2)
Total Protein: 7.4 g/dL (ref 6.0–8.3)

## 2020-11-12 LAB — LIPID PANEL
Cholesterol: 185 mg/dL (ref 0–200)
HDL: 64.9 mg/dL (ref >39.00)
LDL Cholesterol: 103 mg/dL — ABNORMAL HIGH (ref 0–99)
NonHDL: 119.77
Total CHOL/HDL Ratio: 3
Triglycerides: 82 mg/dL (ref 0.0–149.0)
VLDL: 16.4 mg/dL (ref 0.0–40.0)

## 2020-11-12 MED ORDER — AMLODIPINE BESYLATE 5 MG PO TABS: 5.0000 mg | ORAL_TABLET | Freq: Every day | ORAL | 2 refills | Status: DC

## 2020-11-12 NOTE — Progress Notes (Signed)
Chief Complaint  Patient presents with   Follow-up    Restart BP medication    Subjective Samantha Monroe is a 45 y.o. female who presents for hypertension follow up. She does monitor home blood pressures. Blood pressures ranging from 130's/70-80's on average. She is compliant with medications. She is not adhering to a healthy diet overall. Current exercise: walking, some jumping rope No CP or SOB.  Dyslipidemia Patient presents for dyslipidemia follow up. Currently being treated with Crestor 20 mg/d and compliance with treatment thus far has been good. She denies myalgias. The patient is not known to have coexisting coronary artery disease.   Past Medical History:  Diagnosis Date   Allergy    Anxiety    Asthma    Depression    Dyslipidemia (high LDL; low HDL) 10/12/2015   Easy bruising 10/26/2015   Essential hypertension 10/12/2015   GERD (gastroesophageal reflux disease)    Heart murmur    High cholesterol    High triglycerides    Hypertension    Migraines    Obesity 10/12/2015   Seizures ([facility])    Sleep apnea    Exam BP (!) 134/92   Pulse 98   Temp 98.7 F (37.1 C) (Oral)   Ht 5\' 2"  (1.575 m)   Wt 162 lb (73.5 kg)   SpO2 99%   BMI 29.63 kg/m  General:  well developed, well nourished, in no apparent distress Heart: RRR, no bruits, no LE edema Lungs: clear to auscultation, no accessory muscle use Psych: well oriented with normal range of affect and appropriate judgment/insight  Essential hypertension - Plan: amLODipine (NORVASC) 5 MG tablet  Dyslipidemia (high LDL; low HDL) - Plan: Comprehensive metabolic panel, Lipid panel  Chronic, uncontrolled. Go back on Norvasc 5 mg/d. Counseled on diet and exercise. She has gained 10 lbs since last visit, can likely come off again after she loses. Monitor BP at home. Chronic, uncontrolled. Cont Crestor 20 mg/d. Ck LDL.  F/u in 6 mo pending above. The patient voiced understanding and agreement to the plan.  [place], DO 11/12/20  10:32 AM

## 2020-11-12 NOTE — Patient Instructions (Addendum)
Give [place] 2-3 business days to get the results of your labs back.   Keep the diet clean and stay active.  Aim to do some physical exertion for 150 minutes per week. This is typically divided into 5 days per week, 30 minutes per day. The activity should be enough to get your heart rate up. Anything is better than nothing if you have time constraints.  Check your blood pressures 2-3 times per week, alternating the time of day you check it. If it is high, considering waiting 1-2 minutes and rechecking. If it gets higher, your anxiety is likely creeping up and we should avoid rechecking. If not controlled in the next month, I would like to see you.    Take an over the counter iron supplement (Ferrous sulfate) 1-2 times daily as your stomach will allow  Iron Rich foods:  Red meat Prunes Spinach Liver Cream of Wheat  Moderate sources: Iron-fortified cereals (bran, etc) Almonds Beans/peas Pork Lamb Ham Scallops [place] Peaches Peanuts  Let us know if you need anything.  Healthy Eating Plan Many factors influence your heart health, including eating and exercise habits. Heart (coronary) risk increases with abnormal blood fat (lipid) levels. Heart-healthy meal planning includes limiting unhealthy fats, increasing healthy fats, and making other small dietary changes. This includes maintaining a healthy body weight to help keep lipid levels within a normal range.  WHAT IS MY PLAN?  Your health care provider recommends that you: Drink a glass of water before meals to help with satiety. Eat slowly. An alternative to the water is to add Metamucil. This will help with satiety as well. It does contain calories, unlike water.  WHAT TYPES OF FAT SHOULD I CHOOSE? Choose healthy fats more often. Choose monounsaturated and polyunsaturated fats, such as olive oil and canola oil, flaxseeds, walnuts, almonds, and seeds. Eat more omega-3 fats. Good choices include salmon, mackerel, sardines, tuna,  flaxseed oil, and ground flaxseeds. Aim to eat fish at least two times each week. Avoid foods with partially hydrogenated oils in them. These contain trans fats. Examples of foods that contain trans fats are stick margarine, some tub margarines, cookies, crackers, and other baked goods. If you are going to avoid a fat, this is the one to avoid!  WHAT GENERAL GUIDELINES DO I NEED TO FOLLOW? Check food labels carefully to identify foods with trans fats. Avoid these types of options when possible. Fill one half of your plate with vegetables and green salads. Eat 4-5 servings of vegetables per day. A serving of vegetables equals 1 cup of raw leafy vegetables,  cup of raw or cooked cut-up vegetables, or  cup of vegetable juice. Fill one fourth of your plate with whole grains. Look for the word "whole" as the first word in the ingredient list. Fill one fourth of your plate with lean protein foods. Eat 4-5 servings of fruit per day. A serving of fruit equals one medium whole fruit,  cup of dried fruit,  cup of fresh, frozen, or canned fruit. Try to avoid fruits in cups/syrups as the sugar content can be high. Eat more foods that contain soluble fiber. Examples of foods that contain this type of fiber are apples, broccoli, carrots, beans, peas, and barley. Aim to get 20-30 g of fiber per day. Eat more home-cooked food and less restaurant, buffet, and fast food. Limit or avoid alcohol. Limit foods that are high in starch and sugar. Avoid fried foods when able. Cook foods by using methods other than frying.  Baking, boiling, grilling, and broiling are all great options. Other fat-reducing suggestions include: Removing the skin from poultry. Removing all visible fats from meats. Skimming the fat off of stews, soups, and gravies before serving them. Steaming vegetables in water or broth. Lose weight if you are overweight. Losing just 5-10% of your initial body weight can help your overall health and  prevent diseases such as diabetes and heart disease. Increase your consumption of nuts, legumes, and seeds to 4-5 servings per week. One serving of dried beans or legumes equals  cup after being cooked, one serving of nuts equals 1 ounces, and one serving of seeds equals  ounce or 1 tablespoon.  WHAT ARE GOOD FOODS CAN I EAT? Grains Grainy breads (try to find bread that is 3 g of fiber per slice or greater), oatmeal, light popcorn. Whole-grain cereals. Rice and pasta, including brown rice and those that are made with whole wheat. Edamame pasta is a great alternative to grain pasta. It has a higher protein content. Try to avoid significant consumption of white bread, sugary cereals, or pastries/baked goods.  Vegetables All vegetables. Cooked white potatoes do not count as vegetables.  Fruits All fruits, but limit pineapple and bananas as these fruits have a higher sugar content.  Meats and Other Protein Sources Lean, well-trimmed beef, veal, pork, and lamb. Chicken and [place] without skin. All fish and shellfish. Wild duck, rabbit, pheasant, and venison. Egg whites or low-cholesterol egg substitutes. Dried beans, peas, lentils, and tofu. Seeds and most nuts.  Dairy Low-fat or nonfat cheeses, including ricotta, string, and mozzarella. Skim or 1% milk that is liquid, powdered, or evaporated. Buttermilk that is made with low-fat milk. Nonfat or low-fat yogurt. Soy/Almond milk are good alternatives if you cannot handle dairy.  Beverages Water is the best for you. Sports drinks with less sugar are more desirable unless you are a highly active athlete.  Sweets and Desserts Sherbets and fruit ices. Honey, jam, marmalade, jelly, and syrups. Dark chocolate.  Eat all sweets and desserts in moderation.  Fats and Oils Nonhydrogenated (trans-free) margarines. Vegetable oils, including soybean, sesame, sunflower, olive, peanut, safflower, corn, canola, and cottonseed. Salad dressings or mayonnaise  that are made with a vegetable oil. Limit added fats and oils that you use for cooking, baking, salads, and as spreads.  Other Cocoa powder. Coffee and tea. Most condiments.  The items listed above may not be a complete list of recommended foods or beverages. Contact your dietitian for more options.

## 2020-11-13 ENCOUNTER — Ambulatory Visit (INDEPENDENT_AMBULATORY_CARE_PROVIDER_SITE_OTHER): Payer: BC Managed Care – PPO | Admitting: *Deleted

## 2020-11-13 DIAGNOSIS — J309 Allergic rhinitis, unspecified: Secondary | ICD-10-CM

## 2020-11-19 ENCOUNTER — Ambulatory Visit (INDEPENDENT_AMBULATORY_CARE_PROVIDER_SITE_OTHER): Payer: BC Managed Care – PPO

## 2020-11-19 DIAGNOSIS — J309 Allergic rhinitis, unspecified: Secondary | ICD-10-CM

## 2020-11-27 ENCOUNTER — Ambulatory Visit (INDEPENDENT_AMBULATORY_CARE_PROVIDER_SITE_OTHER): Payer: BC Managed Care – PPO

## 2020-11-27 DIAGNOSIS — J309 Allergic rhinitis, unspecified: Secondary | ICD-10-CM | POA: Diagnosis not present

## 2020-12-04 ENCOUNTER — Ambulatory Visit (INDEPENDENT_AMBULATORY_CARE_PROVIDER_SITE_OTHER): Payer: BC Managed Care – PPO

## 2020-12-04 DIAGNOSIS — J309 Allergic rhinitis, unspecified: Secondary | ICD-10-CM

## 2020-12-11 ENCOUNTER — Ambulatory Visit (INDEPENDENT_AMBULATORY_CARE_PROVIDER_SITE_OTHER): Payer: BC Managed Care – PPO | Admitting: *Deleted

## 2020-12-11 DIAGNOSIS — J309 Allergic rhinitis, unspecified: Secondary | ICD-10-CM | POA: Diagnosis not present

## 2020-12-13 DIAGNOSIS — Z6830 Body mass index (BMI) 30.0-30.9, adult: Secondary | ICD-10-CM | POA: Diagnosis not present

## 2020-12-13 DIAGNOSIS — Z01419 Encounter for gynecological examination (general) (routine) without abnormal findings: Secondary | ICD-10-CM | POA: Diagnosis not present

## 2020-12-13 DIAGNOSIS — Z1231 Encounter for screening mammogram for malignant neoplasm of breast: Secondary | ICD-10-CM | POA: Diagnosis not present

## 2020-12-17 ENCOUNTER — Other Ambulatory Visit: Payer: Self-pay | Admitting: Obstetrics and Gynecology

## 2020-12-17 DIAGNOSIS — R928 Other abnormal and inconclusive findings on diagnostic imaging of breast: Secondary | ICD-10-CM

## 2020-12-20 ENCOUNTER — Ambulatory Visit
Admission: RE | Admit: 2020-12-20 | Discharge: 2020-12-20 | Disposition: A | Discharge status: Home / Self Care | Payer: BC Managed Care – PPO | Source: Ambulatory Visit | Attending: Obstetrics and Gynecology | Admitting: Obstetrics and Gynecology

## 2020-12-20 DIAGNOSIS — R922 Inconclusive mammogram: Secondary | ICD-10-CM | POA: Diagnosis not present

## 2020-12-20 DIAGNOSIS — R928 Other abnormal and inconclusive findings on diagnostic imaging of breast: Secondary | ICD-10-CM

## 2020-12-20 NOTE — Patient Instructions (Signed)
Asthma Continue montelukast 10 mg once a day to prevent cough or wheeze Continue Flovent 110-2 puffs twice a day with a spacer to prevent cough or wheeze Continue albuterol 2 puffs once every 4 hours as needed for cough or wheeze. You may use albuterol 2 puffs 5 to 15 minutes before activity to decrease cough or wheeze  Allergic rhinitis Continue allergen avoidance measures directed toward pollen, pets, mold, cockroach, and dust mite as listed below Continue allergen immunotherapy once every 2 weeks and have access to an epinephrine autoinjector set Continue an antihistamine once a day as needed for runny nose or itch.Remember to rotate to a different antihistamine about every 3 months. Some examples of over the counter antihistamines include Zyrtec (cetirizine), Xyzal (levocetirizine), Allegra (fexofenadine), and Claritin (loratidine).  Continue Dymista 1 spray in each nostril twice a day as needed for nasal symptoms Consider saline nasal rinses as needed for nasal symptoms. Use this before any medicated nasal sprays for best result  Food allergy Continue to avoid shellfish. In case of an allergic reaction, take Benadryl 50 mg every 4 hours, and if life-threatening symptoms occur, inject with AuviQ 0.3 mg. Return to the clinic to update your food allergy testing if you are interested in.  Remember to stop antihistamines for 3 days before your testing appointment.  Call the clinic if this treatment plan is not working well for you.  Follow up in 6 months or sooner if needed.  Reducing Pollen Exposure The American Academy of Allergy, Asthma and Immunology suggests the following steps to reduce your exposure to pollen during allergy seasons. Do not hang sheets or clothing out to dry; pollen may collect on these items. Do not mow lawns or spend time around freshly cut grass; mowing stirs up pollen. Keep windows closed at night.  Keep car windows closed while driving. Minimize morning  activities outdoors, a time when pollen counts are usually at their highest. Stay indoors as much as possible when pollen counts or humidity is high and on windy days when pollen tends to remain in the air longer. Use air conditioning when possible.  Many air conditioners have filters that trap the pollen spores. Use a HEPA room air filter to remove pollen form the indoor air you breathe.  Control of Dog or Cat Allergen Avoidance is the best way to manage a dog or cat allergy. If you have a dog or cat and are allergic to dog or cats, consider removing the dog or cat from the home. If you have a dog or cat but don't want to find it a new home, or if your family wants a pet even though someone in the household is allergic, here are some strategies that may help keep symptoms at bay:  Keep the pet out of your bedroom and restrict it to only a few rooms. Be advised that keeping the dog or cat in only one room will not limit the allergens to that room. Don't pet, hug or kiss the dog or cat; if you do, wash your hands with soap and water. High-efficiency particulate air (HEPA) cleaners run continuously in a bedroom or living room can reduce allergen levels over time. Regular use of a high-efficiency vacuum cleaner or a central vacuum can reduce allergen levels. Giving your dog or cat a bath at least once a week can reduce airborne allergen.  Control of Mold Allergen Mold and fungi can grow on a variety of surfaces provided certain temperature and moisture conditions exist.  Outdoor molds grow on plants, decaying vegetation and soil.  The major outdoor mold, Alternaria and Cladosporium, are found in very high numbers during hot and dry conditions.  Generally, a late Summer - Fall peak is seen for common outdoor fungal spores.  Rain will temporarily lower outdoor mold spore count, but counts rise rapidly when the rainy period ends.  The most important indoor molds are Aspergillus and Penicillium.  Dark,  humid and poorly ventilated basements are ideal sites for mold growth.  The next most common sites of mold growth are the bathroom and the kitchen.  Outdoor [employer] Use air conditioning and keep windows closed Avoid exposure to decaying vegetation. Avoid leaf raking. Avoid grain handling. Consider wearing a face mask if working in moldy areas.  Indoor Mold Control Maintain humidity below 50%. Clean washable surfaces with 5% bleach solution. Remove sources e.g. Contaminated carpets.   Control of Dust Mite Allergen Dust mites play a major role in allergic asthma and rhinitis. They occur in environments with high humidity wherever human skin is found. Dust mites absorb humidity from the atmosphere (ie, they do not drink) and feed on organic matter (including shed human and animal skin). Dust mites are a microscopic type of insect that you cannot see with the naked eye. High levels of dust mites have been detected from mattresses, pillows, carpets, upholstered furniture, bed covers, clothes, soft toys and any woven material. The principal allergen of the dust mite is found in its feces. A gram of dust may contain 1,000 mites and 250,000 fecal particles. Mite antigen is easily measured in the air during house cleaning activities. Dust mites do not bite and do not cause harm to humans, other than by triggering allergies/asthma.  Ways to decrease your exposure to dust mites in your home:  1. Encase mattresses, box springs and pillows with a mite-impermeable barrier or cover  2. Wash sheets, blankets and drapes weekly in hot water (130 F) with detergent and dry them in a dryer on the hot setting.  3. Have the room cleaned frequently with a vacuum cleaner and a damp dust-mop. For carpeting or rugs, vacuuming with a vacuum cleaner equipped with a high-efficiency particulate air (HEPA) filter. The dust mite allergic individual should not be in a room which is being cleaned and should wait 1 hour  after cleaning before going into the room.  4. Do not sleep on upholstered furniture (eg, couches).  5. If possible removing carpeting, upholstered furniture and drapery from the home is ideal. Horizontal blinds should be eliminated in the rooms where the person spends the most time (bedroom, study, television room). Washable vinyl, roller-type shades are optimal.  6. Remove all non-washable stuffed toys from the bedroom. Wash stuffed toys weekly like sheets and blankets above.  7. Reduce indoor humidity to less than 50%. Inexpensive humidity monitors can be purchased at most hardware stores. Do not use a humidifier as can make the problem worse and are not recommended.  Control of Cockroach Allergen  Cockroach allergen has been identified as an important cause of acute attacks of asthma, especially in urban settings.  There are fifty-five species of cockroach that exist in the [place], however only three, the [place], [place] species produce allergen that can affect patients with Asthma.  Allergens can be obtained from fecal particles, egg casings and secretions from cockroaches.    Remove food sources. Reduce access to water. Seal access and entry points. Spray runways with 0.5-1% Diazinon or  Chlorpyrifos Blow boric acid power under stoves and refrigerator. Place bait stations (hydramethylnon) at feeding sites.

## 2020-12-20 NOTE — Progress Notes (Signed)
[facility] [place]  [id number] Dept: [phone]  FOLLOW UP NOTE  Patient ID: Samantha Monroe, female    DOB: 05-Dec-1975  Age: 45 y.o. MRN: [id number] Date of Office Visit: 12/21/2020  Assessment  Chief Complaint: Follow-up (Patient states no issues)  HPI Samantha Monroe is a 45 year old female who presents to the clinic for follow-up visit.  She was last seen in the clinic on 12/08/2019 for evaluation of asthma, allergic rhinitis, allergic conjunctivitis, and food allergy to shellfish.  At today's visit, she reports her asthma has been well controlled with no shortness of breath or cough with activity or rest.  She does report occasional wheeze occurring with weather changes.  She continues montelukast 10 mg once a day, Flovent 110-2 puffs twice a day and rarely uses albuterol.  Allergic rhinitis is reported as moderately well controlled with nasal congestion as the main symptom.  She continues cetirizine 10 mg once a day and Dymista 1 spray in each nostril once a day.  She is not currently using a nasal saline rinse.  She continues allergen immunotherapy once every other week with occasional redness and itchiness at the injection site.  She reports a significant decrease in her symptoms of allergic rhinitis while continuing on allergen immunotherapy.  Allergic conjunctivitis is reported as well controlled with no medical intervention at this time.  She continues to avoid shellfish with no accidental ingestion or EpiPen use since her last visit to this clinic.  Her last food allergy skin testing was 12/30/2017 and was positive to shrimp, crab, lobster, and oyster.  Her current medications are listed in the chart.   Drug Allergies:  Allergies  Allergen Reactions   Aspirin Other (See Comments)    Eye redness, felt like throat was closing.   Motrin [Ibuprofen]     Felt like throat was closing   Shellfish Allergy Other (See Comments)    Felt like throat was closing    Physical  Exam: BP 128/78   Pulse 76   Temp 97.9 F (36.6 C) (Temporal)   Resp 16   Ht 5\' 2"  (1.575 m)   Wt 166 lb 6.4 oz (75.5 kg)   LMP 12/12/2020   SpO2 97%   BMI 30.43 kg/m    Physical Exam Vitals reviewed.  Constitutional:      Appearance: Normal appearance.  HENT:     Head: Normocephalic and atraumatic.     Right Ear: Tympanic membrane normal.     Left Ear: Tympanic membrane normal.     Nose:     Comments: Bilateral nares slightly erythematous with clear nasal drainage noted.  Pharynx normal.  Ears normal.  Eyes normal.    Mouth/Throat:     Pharynx: Oropharynx is clear.  Eyes:     Conjunctiva/sclera: Conjunctivae normal.  Cardiovascular:     Rate and Rhythm: Normal rate and regular rhythm.     Heart sounds: Normal heart sounds. No murmur heard. Pulmonary:     Effort: Pulmonary effort is normal.     Breath sounds: Normal breath sounds.     Comments: Lungs clear to auscultation Musculoskeletal:        General: Normal range of motion.     Cervical back: Normal range of motion and neck supple.  Skin:    General: Skin is warm and dry.  Neurological:     Mental Status: She is alert and oriented to person, place, and time.  Psychiatric:        Mood  and Affect: Mood normal.        Behavior: Behavior normal.        Thought Content: Thought content normal.        Judgment: Judgment normal.    Diagnostics: FVC 3.10, FEV1 2.23.  Predicted FVC 2.81, predicted FEV1 2.29.  Spirometry indicates normal ventilatory function.  Assessment and Plan: 1. Moderate persistent asthma without complication   2. Seasonal and perennial allergic rhinitis   3. Allergic conjunctivitis of both eyes   4. Anaphylactic shock due to food, subsequent encounter     Meds ordered this encounter  Medications   montelukast (SINGULAIR) 10 MG tablet    Sig: Take 1 tablet (10 mg total) by mouth at bedtime.    Dispense:  30 tablet    Refill:  5   fluticasone (FLOVENT HFA) 110 MCG/ACT inhaler    Sig:  Inhale 2 puffs twice a day with spacer to help prevent cough and wheeze.    Dispense:  1 each    Refill:  5   EPINEPHrine (AUVI-Q) 0.3 mg/0.3 mL IJ SOAJ injection    Sig: Use as directed for severe allergic reaction    Dispense:  2 each    Refill:  1   Azelastine-Fluticasone 137-50 MCG/ACT SUSP    Sig: Place 2 sprays into the nose 2 (two) times daily.    Dispense:  23 g    Refill:  5   albuterol (VENTOLIN HFA) 108 (90 Base) MCG/ACT inhaler    Sig: Inhale 2 puffs into the lungs every 4 (four) hours as needed for wheezing or shortness of breath.    Dispense:  18 g    Refill:  1     Patient Instructions  Asthma Continue montelukast 10 mg once a day to prevent cough or wheeze Continue Flovent 110-2 puffs twice a day with a spacer to prevent cough or wheeze Continue albuterol 2 puffs once every 4 hours as needed for cough or wheeze. You may use albuterol 2 puffs 5 to 15 minutes before activity to decrease cough or wheeze  Allergic rhinitis Continue allergen avoidance measures directed toward pollen, pets, mold, cockroach, and dust mite as listed below Continue allergen immunotherapy once every 2 weeks and have access to an epinephrine autoinjector set Continue an antihistamine once a day as needed for runny nose or itch.Remember to rotate to a different antihistamine about every 3 months. Some examples of over the counter antihistamines include Zyrtec (cetirizine), Xyzal (levocetirizine), Allegra (fexofenadine), and Claritin (loratidine).  Continue Dymista 1 spray in each nostril twice a day as needed for nasal symptoms Consider saline nasal rinses as needed for nasal symptoms. Use this before any medicated nasal sprays for best result  Food allergy Continue to avoid shellfish. In case of an allergic reaction, take Benadryl 50 mg every 4 hours, and if life-threatening symptoms occur, inject with AuviQ 0.3 mg. Return to the clinic to update your food allergy testing if you are interested  in.  Remember to stop antihistamines for 3 days before your testing appointment.  Call the clinic if this treatment plan is not working well for you.  Follow up in 6 months or sooner if needed.   Return in about 6 months (around 06/20/2021), or if symptoms worsen or fail to improve.    Thank you for the opportunity to care for this patient.  Please do not hesitate to contact me with questions.  Gareth Morgan, FNP [facility]

## 2020-12-21 ENCOUNTER — Ambulatory Visit: Payer: BC Managed Care – PPO | Admitting: Family Medicine

## 2020-12-21 ENCOUNTER — Other Ambulatory Visit: Payer: Self-pay

## 2020-12-21 ENCOUNTER — Encounter: Payer: Self-pay | Admitting: Family Medicine

## 2020-12-21 VITALS — BP 128/78 | HR 76 | Temp 97.9°F | Resp 16 | Ht 62.0 in | Wt 166.4 lb

## 2020-12-21 DIAGNOSIS — J302 Other seasonal allergic rhinitis: Secondary | ICD-10-CM

## 2020-12-21 DIAGNOSIS — T7800XA Anaphylactic reaction due to unspecified food, initial encounter: Secondary | ICD-10-CM | POA: Insufficient documentation

## 2020-12-21 DIAGNOSIS — J4540 Moderate persistent asthma, uncomplicated: Secondary | ICD-10-CM

## 2020-12-21 DIAGNOSIS — H1013 Acute atopic conjunctivitis, bilateral: Secondary | ICD-10-CM | POA: Insufficient documentation

## 2020-12-21 DIAGNOSIS — J3089 Other allergic rhinitis: Secondary | ICD-10-CM | POA: Diagnosis not present

## 2020-12-21 DIAGNOSIS — J45909 Unspecified asthma, uncomplicated: Secondary | ICD-10-CM | POA: Insufficient documentation

## 2020-12-21 DIAGNOSIS — T7800XD Anaphylactic reaction due to unspecified food, subsequent encounter: Secondary | ICD-10-CM | POA: Diagnosis not present

## 2020-12-21 MED ORDER — AZELASTINE-FLUTICASONE 137-50 MCG/ACT NA SUSP
2.0000 | Freq: Two times a day (BID) | NASAL | 5 refills | Status: DC
Start: 2020-12-21 — End: 2021-07-18

## 2020-12-21 MED ORDER — FLUTICASONE PROPIONATE HFA 110 MCG/ACT IN AERO
INHALATION_SPRAY | RESPIRATORY_TRACT | 5 refills | Status: DC
Start: 2020-12-21 — End: 2021-07-18

## 2020-12-21 MED ORDER — EPINEPHRINE 0.3 MG/0.3ML IJ SOAJ
INTRAMUSCULAR | 1 refills | Status: AC
Start: 2020-12-21 — End: ?

## 2020-12-21 MED ORDER — ALBUTEROL SULFATE HFA 108 (90 BASE) MCG/ACT IN AERS: 2.0000 | INHALATION_SPRAY | RESPIRATORY_TRACT | 1 refills | Status: DC | PRN

## 2020-12-21 MED ORDER — MONTELUKAST SODIUM 10 MG PO TABS
10.0000 mg | ORAL_TABLET | Freq: Every day | ORAL | 5 refills | Status: DC
Start: 2020-12-21 — End: 2021-06-20

## 2020-12-25 ENCOUNTER — Ambulatory Visit (INDEPENDENT_AMBULATORY_CARE_PROVIDER_SITE_OTHER): Payer: BC Managed Care – PPO | Admitting: *Deleted

## 2020-12-25 DIAGNOSIS — J309 Allergic rhinitis, unspecified: Secondary | ICD-10-CM

## 2020-12-25 DIAGNOSIS — Z01419 Encounter for gynecological examination (general) (routine) without abnormal findings: Secondary | ICD-10-CM | POA: Diagnosis not present

## 2021-01-08 ENCOUNTER — Ambulatory Visit (INDEPENDENT_AMBULATORY_CARE_PROVIDER_SITE_OTHER): Payer: BC Managed Care – PPO

## 2021-01-08 DIAGNOSIS — J309 Allergic rhinitis, unspecified: Secondary | ICD-10-CM

## 2021-01-25 ENCOUNTER — Ambulatory Visit (INDEPENDENT_AMBULATORY_CARE_PROVIDER_SITE_OTHER): Payer: BC Managed Care – PPO

## 2021-01-25 DIAGNOSIS — J309 Allergic rhinitis, unspecified: Secondary | ICD-10-CM

## 2021-01-29 NOTE — Progress Notes (Signed)
EXP 01/31/22

## 2021-01-31 DIAGNOSIS — J3081 Allergic rhinitis due to animal (cat) (dog) hair and dander: Secondary | ICD-10-CM | POA: Diagnosis not present

## 2021-02-07 ENCOUNTER — Ambulatory Visit (INDEPENDENT_AMBULATORY_CARE_PROVIDER_SITE_OTHER): Payer: BC Managed Care – PPO

## 2021-02-07 DIAGNOSIS — J309 Allergic rhinitis, unspecified: Secondary | ICD-10-CM | POA: Diagnosis not present

## 2021-02-21 ENCOUNTER — Ambulatory Visit (INDEPENDENT_AMBULATORY_CARE_PROVIDER_SITE_OTHER): Payer: BC Managed Care – PPO | Admitting: *Deleted

## 2021-02-21 ENCOUNTER — Other Ambulatory Visit: Payer: Self-pay | Admitting: Family Medicine

## 2021-02-21 DIAGNOSIS — J309 Allergic rhinitis, unspecified: Secondary | ICD-10-CM | POA: Diagnosis not present

## 2021-03-07 ENCOUNTER — Ambulatory Visit (INDEPENDENT_AMBULATORY_CARE_PROVIDER_SITE_OTHER): Payer: BC Managed Care – PPO

## 2021-03-07 DIAGNOSIS — J309 Allergic rhinitis, unspecified: Secondary | ICD-10-CM

## 2021-03-22 ENCOUNTER — Ambulatory Visit (INDEPENDENT_AMBULATORY_CARE_PROVIDER_SITE_OTHER): Payer: BC Managed Care – PPO | Admitting: *Deleted

## 2021-03-22 DIAGNOSIS — J309 Allergic rhinitis, unspecified: Secondary | ICD-10-CM | POA: Diagnosis not present

## 2021-03-29 ENCOUNTER — Ambulatory Visit (INDEPENDENT_AMBULATORY_CARE_PROVIDER_SITE_OTHER): Payer: BC Managed Care – PPO

## 2021-03-29 DIAGNOSIS — J309 Allergic rhinitis, unspecified: Secondary | ICD-10-CM | POA: Diagnosis not present

## 2021-04-04 ENCOUNTER — Ambulatory Visit (INDEPENDENT_AMBULATORY_CARE_PROVIDER_SITE_OTHER): Payer: BC Managed Care – PPO

## 2021-04-04 DIAGNOSIS — J309 Allergic rhinitis, unspecified: Secondary | ICD-10-CM

## 2021-04-11 ENCOUNTER — Ambulatory Visit (INDEPENDENT_AMBULATORY_CARE_PROVIDER_SITE_OTHER): Payer: BC Managed Care – PPO | Admitting: *Deleted

## 2021-04-11 DIAGNOSIS — J309 Allergic rhinitis, unspecified: Secondary | ICD-10-CM | POA: Diagnosis not present

## 2021-04-16 ENCOUNTER — Ambulatory Visit (INDEPENDENT_AMBULATORY_CARE_PROVIDER_SITE_OTHER): Payer: BC Managed Care – PPO

## 2021-04-16 DIAGNOSIS — J309 Allergic rhinitis, unspecified: Secondary | ICD-10-CM | POA: Diagnosis not present

## 2021-04-17 ENCOUNTER — Other Ambulatory Visit: Payer: Self-pay | Admitting: Family Medicine

## 2021-05-02 ENCOUNTER — Ambulatory Visit (INDEPENDENT_AMBULATORY_CARE_PROVIDER_SITE_OTHER): Payer: BC Managed Care – PPO

## 2021-05-02 DIAGNOSIS — J309 Allergic rhinitis, unspecified: Secondary | ICD-10-CM

## 2021-05-13 ENCOUNTER — Ambulatory Visit (INDEPENDENT_AMBULATORY_CARE_PROVIDER_SITE_OTHER): Payer: BC Managed Care – PPO

## 2021-05-13 DIAGNOSIS — J309 Allergic rhinitis, unspecified: Secondary | ICD-10-CM | POA: Diagnosis not present

## 2021-05-21 ENCOUNTER — Encounter: Payer: Self-pay | Admitting: Family Medicine

## 2021-05-21 ENCOUNTER — Ambulatory Visit (INDEPENDENT_AMBULATORY_CARE_PROVIDER_SITE_OTHER): Payer: BC Managed Care – PPO | Admitting: Family Medicine

## 2021-05-21 VITALS — BP 120/84 | HR 94 | Temp 98.4°F | Ht 62.0 in | Wt 163.0 lb

## 2021-05-21 DIAGNOSIS — Z0000 Encounter for general adult medical examination without abnormal findings: Secondary | ICD-10-CM | POA: Diagnosis not present

## 2021-05-21 LAB — COMPREHENSIVE METABOLIC PANEL
ALT: 11 U/L (ref 0–35)
AST: 15 U/L (ref 0–37)
Albumin: 4.4 g/dL (ref 3.5–5.2)
Alkaline Phosphatase: 58 U/L (ref 39–117)
BUN: 10 mg/dL (ref 6–23)
CO2: 26 mEq/L (ref 19–32)
Calcium: 9.6 mg/dL (ref 8.4–10.5)
Chloride: 102 mEq/L (ref 96–112)
Creatinine, Ser: 0.59 mg/dL (ref 0.40–1.20)
GFR: 107.22 mL/min (ref >60.00)
Glucose, Bld: 85 mg/dL (ref 70–99)
Potassium: 4 mEq/L (ref 3.5–5.1)
Sodium: 137 mEq/L (ref 135–145)
Total Bilirubin: 0.4 mg/dL (ref 0.2–1.2)
Total Protein: 6.9 g/dL (ref 6.0–8.3)

## 2021-05-21 LAB — LIPID PANEL
Cholesterol: 186 mg/dL (ref 0–200)
HDL: 65.6 mg/dL (ref >39.00)
LDL Cholesterol: 102 mg/dL — ABNORMAL HIGH (ref 0–99)
NonHDL: 120.44
Total CHOL/HDL Ratio: 3
Triglycerides: 92 mg/dL (ref 0.0–149.0)
VLDL: 18.4 mg/dL (ref 0.0–40.0)

## 2021-05-21 LAB — CBC
HCT: 41.6 % (ref 36.0–46.0)
Hemoglobin: 13.9 g/dL (ref 12.0–15.0)
MCHC: 33.4 g/dL (ref 30.0–36.0)
MCV: 88.6 fl (ref 78.0–100.0)
Platelets: 265 10*3/uL (ref 150.0–400.0)
RBC: 4.69 Mil/uL (ref 3.87–5.11)
RDW: 14.2 % (ref 11.5–15.5)
WBC: 6 10*3/uL (ref 4.0–10.5)

## 2021-05-21 NOTE — Progress Notes (Signed)
Chief Complaint  ?Patient presents with  ? Annual Exam  ?  ? ?Well Woman ?Samantha Monroe is here for a complete physical.   ?Her last physical was >1 year ago.  ?Current diet: in general, a "healthy" diet. ?Current exercise: walking. ?Weight is stable and she denies fatigue out of ordinary. ?Seatbelt? Yes ?Advanced directive? No ? ?Health Maintenance ?Pap/HPV- Yes ?Mammogram- Yes ?Tetanus- Yes ?Hep C screening- Yes ?HIV screening- Yes ? ?Past Medical History:  ?Diagnosis Date  ? Allergy   ? Anxiety   ? Asthma   ? Depression   ? Dyslipidemia (high LDL; low HDL) 10/12/2015  ? Easy bruising 10/26/2015  ? Essential hypertension 10/12/2015  ? GERD (gastroesophageal reflux disease)   ? Heart murmur   ? High cholesterol   ? High triglycerides   ? Hypertension   ? Migraines   ? Obesity 10/12/2015  ? Seizures ([place])   ? Sleep apnea   ?  ? ?Past Surgical History:  ?Procedure Laterality Date  ? APPENDECTOMY    ? CESAREAN SECTION    ? 3 C sections  ? DILITATION & CURRETTAGE/HYSTROSCOPY WITH THERMACHOICE ABLATION  01/09/2012  ? Procedure: DILATATION & CURETTAGE/HYSTEROSCOPY WITH THERMACHOICE ABLATION;  Surgeon: Cyril Mourning, MD;  Location: [facility];  Service: Gynecology;  Laterality: N/A;  ? TUBAL LIGATION    ? ? ?Medications  ?Current Outpatient Medications on File Prior to Visit  ?Medication Sig Dispense Refill  ? albuterol (VENTOLIN HFA) 108 (90 Base) MCG/ACT inhaler Inhale 2 puffs into the lungs every 4 (four) hours as needed for wheezing or shortness of breath. 18 g 1  ? amLODipine (NORVASC) 5 MG tablet Take 1 tablet (5 mg total) by mouth daily. 90 tablet 2  ? Azelastine-Fluticasone 137-50 MCG/ACT SUSP Place 2 sprays into the nose 2 (two) times daily. 23 g 5  ? EPINEPHrine (AUVI-Q) 0.3 mg/0.3 mL IJ SOAJ injection Use as directed for severe allergic reaction 2 each 1  ? escitalopram (LEXAPRO) 10 MG tablet Take 10 mg by mouth daily.    ? fluticasone (FLOVENT HFA) 110 MCG/ACT inhaler Inhale 2 puffs twice a day with spacer to  help prevent cough and wheeze. 1 each 5  ? montelukast (SINGULAIR) 10 MG tablet Take 1 tablet (10 mg total) by mouth at bedtime. 30 tablet 5  ? rosuvastatin (CRESTOR) 20 MG tablet TAKE 1 TABLET BY MOUTH EVERY DAY 90 tablet 3  ? zolpidem (AMBIEN) 10 MG tablet Take 10 mg by mouth at bedtime as needed. For insomina    ? ? ?Allergies ?Allergies  ?Allergen Reactions  ? Aspirin Other (See Comments)  ?  Eye redness, felt like throat was closing.  ? Motrin [Ibuprofen]   ?  Felt like throat was closing  ? Shellfish Allergy Other (See Comments)  ?  Felt like throat was closing  ? ? ?Review of Systems: ?Constitutional:  no unexpected weight changes ?Eye:  no recent significant change in vision ?Ear/Nose/Mouth/Throat:  Ears:  no recent change in hearing ?Nose/Mouth/Throat:  no complaints of nasal congestion, no sore throat ?Cardiovascular: no chest pain ?Respiratory:  no shortness of breath ?Gastrointestinal:  no abdominal pain, +intermittent constipation ?GU:  Female: negative for dysuria or pelvic pain ?Musculoskeletal/Extremities:  no pain of the joints ?Integumentary (Skin/Breast):  no abnormal skin lesions reported ?Neurologic:  no headaches ?Endocrine:  denies fatigue ?Hematologic/Lymphatic:  No areas of easy bleeding ? ?Exam ?BP 120/84   Pulse 94   Temp 98.4 ?F (36.9 ?C) (Oral)   Ht  $'5\' 2"'R$  (1.575 m)   Wt 163 lb (73.9 kg)   SpO2 99%   BMI 29.81 kg/m?  ?General:  well developed, well nourished, in no apparent distress ?Skin:  no significant moles, warts, or growths ?Head:  no masses, lesions, or tenderness ?Eyes:  pupils equal and round, sclera anicteric without injection ?Ears:  canals without lesions, TMs shiny without retraction, no obvious effusion, no erythema ?Nose:  nares patent, septum midline, mucosa normal, and no drainage or sinus tenderness ?Throat/Pharynx:  lips and gingiva without lesion; tongue and uvula midline; non-inflamed pharynx; no exudates or postnasal drainage ?Neck: neck supple without  adenopathy, thyromegaly, or masses ?Lungs:  clear to auscultation, breath sounds equal bilaterally, no respiratory distress ?Cardio:  regular rate and rhythm, no LE edema ?Abdomen:  abdomen soft, nontender; bowel sounds normal; no masses or organomegaly ?Genital: Defer to GYN ?Musculoskeletal:  symmetrical muscle groups noted without atrophy or deformity ?Extremities:  no clubbing, cyanosis, or edema, no deformities, no skin discoloration ?Neuro:  gait normal; deep tendon reflexes normal and symmetric ?Psych: well oriented with normal range of affect and appropriate judgment/insight ? ?Assessment and Plan ? ?Well adult exam - Plan: CBC, Comprehensive metabolic panel, Lipid panel  ? ?Well 46 y.o. female. ?Counseled on diet and exercise. ?Advanced directive form provided today.  ?Stay hydrated, consider fiber supp.  ?Other orders as above. ?Follow up in 6 mo or prn. ?The patient voiced understanding and agreement to the plan. ? ?Shelda Pal, DO ?05/21/21 ?8:27 AM ? ?

## 2021-05-21 NOTE — Patient Instructions (Addendum)
Give [place] 2-3 business days to get the results of your labs back.  ? ?Keep the diet clean and stay active. ? ?Please get me a copy of your advanced directive form at your convenience.  ? ?Try to drink 55-60 oz of water daily outside of exercise. ? ?Take Metamucil or Benefiber daily. ? ?Let us know if you need anything. ?

## 2021-05-28 ENCOUNTER — Ambulatory Visit (INDEPENDENT_AMBULATORY_CARE_PROVIDER_SITE_OTHER): Payer: BC Managed Care – PPO

## 2021-05-28 DIAGNOSIS — J309 Allergic rhinitis, unspecified: Secondary | ICD-10-CM | POA: Diagnosis not present

## 2021-05-29 ENCOUNTER — Other Ambulatory Visit: Payer: Self-pay | Admitting: Family Medicine

## 2021-05-29 NOTE — Progress Notes (Signed)
VIALS EXP 05-30-22 ?

## 2021-05-30 DIAGNOSIS — J3089 Other allergic rhinitis: Secondary | ICD-10-CM | POA: Diagnosis not present

## 2021-06-13 ENCOUNTER — Ambulatory Visit (INDEPENDENT_AMBULATORY_CARE_PROVIDER_SITE_OTHER): Payer: BC Managed Care – PPO

## 2021-06-13 DIAGNOSIS — J309 Allergic rhinitis, unspecified: Secondary | ICD-10-CM | POA: Diagnosis not present

## 2021-06-20 ENCOUNTER — Other Ambulatory Visit: Payer: Self-pay | Admitting: Family Medicine

## 2021-06-21 ENCOUNTER — Other Ambulatory Visit: Payer: Self-pay | Admitting: Family Medicine

## 2021-06-21 NOTE — Telephone Encounter (Signed)
Called and spoke to patient and informed her that her refill was approved and that she needed to schedule a follow up appointment. Patient scheduled an appointment with Dr. Ernst Bowler.  ?

## 2021-06-28 ENCOUNTER — Ambulatory Visit (INDEPENDENT_AMBULATORY_CARE_PROVIDER_SITE_OTHER): Payer: BC Managed Care – PPO | Admitting: *Deleted

## 2021-06-28 DIAGNOSIS — J309 Allergic rhinitis, unspecified: Secondary | ICD-10-CM | POA: Diagnosis not present

## 2021-07-11 ENCOUNTER — Ambulatory Visit (INDEPENDENT_AMBULATORY_CARE_PROVIDER_SITE_OTHER): Payer: BC Managed Care – PPO

## 2021-07-11 DIAGNOSIS — J309 Allergic rhinitis, unspecified: Secondary | ICD-10-CM

## 2021-07-18 ENCOUNTER — Encounter: Payer: Self-pay | Admitting: Allergy & Immunology

## 2021-07-18 ENCOUNTER — Ambulatory Visit (INDEPENDENT_AMBULATORY_CARE_PROVIDER_SITE_OTHER): Payer: BC Managed Care – PPO | Admitting: Allergy & Immunology

## 2021-07-18 VITALS — BP 130/82 | HR 77 | Temp 98.4°F | Resp 18 | Ht 60.75 in | Wt 166.1 lb

## 2021-07-18 DIAGNOSIS — J3089 Other allergic rhinitis: Secondary | ICD-10-CM | POA: Diagnosis not present

## 2021-07-18 DIAGNOSIS — J4540 Moderate persistent asthma, uncomplicated: Secondary | ICD-10-CM | POA: Diagnosis not present

## 2021-07-18 DIAGNOSIS — T7800XD Anaphylactic reaction due to unspecified food, subsequent encounter: Secondary | ICD-10-CM

## 2021-07-18 DIAGNOSIS — J302 Other seasonal allergic rhinitis: Secondary | ICD-10-CM

## 2021-07-18 NOTE — Progress Notes (Signed)
FOLLOW UP  Date of Service/Encounter:  07/18/21   Assessment:   Mild persistent asthma, uncomplicated   Perennial and seasonal allergic rhinitis - on allergen immunotherapy with maintenance reached March 2022  Food allergies (shellfish)  Plan/Recommendations:   1. Moderate persistent asthma without complication - Lung testing not done.  - Let's decrease to one puff twice daily to see how you do.  - If your asthma control worsens, go back to two puffs twice daily.  - Daily controller medication(s): Flovent 172mg 1 puffs twice daily with spacer - Prior to physical activity: albuterol 2 puffs 10-15 minutes before physical activity. - Rescue medications: albuterol 4 puffs every 4-6 hours as needed - Asthma control goals:  * Full participation in all desired activities (may need albuterol before activity) * Albuterol use two time or less a week on average (not counting use with activity) * Cough interfering with sleep two time or less a month * Oral steroids no more than once a year * No hospitalizations  2. Seasonal and perennial allergic rhinitis - Continue with allergy shots at the same schedule. - Continue with Singulair '10mg'$  at night. - Continue with Dymista every morning.  - Continue with shots for a total of 5 years (March 2027).   3. Anaphylactic shock due to food, (shellfish)  - EpiPen is up to date. - Continue with avoidance of shellfish. - Repeating testing declined.   4. Return in about 1 year (around 07/19/2022).   Subjective:   Samantha Monroe a 46y.o. female presenting today for follow up of  Chief Complaint  Patient presents with   Allergic Rhinitis     Allergies have been doing well. Allergy shots have been helping.    Asthma    Asthma has been good. Every once in a while has to use rescue inhaler.    Medication Refill    Nasal spray.     Samantha D MSpadaccinihas a history of the following: Patient Active Problem List   Diagnosis Date Noted    Extrinsic asthma 12/21/2020   Seasonal and perennial allergic rhinitis 12/21/2020   Allergic conjunctivitis of both eyes 12/21/2020   Anaphylactic shock due to adverse food reaction 12/21/2020   Abdominal pain, epigastric 07/23/2017   Bloating 07/23/2017   Gastroesophageal reflux disease 03/20/2017   Diastasis recti 03/20/2017   Easy bruising 10/26/2015   Depression with anxiety 10/26/2015   Obesity 10/12/2015   Dyslipidemia (high LDL; low HDL) 10/12/2015   Essential hypertension 10/12/2015    History obtained from: chart review and patient.  Samantha Monroe is a 46y.o. female presenting for a follow up visit.  She was last seen in November 2022.  At that time, she saw one of our esteemed nurse practitioners Samantha Monroe.  At that time, she was doing very well on montelukast as well as Flovent 110 mcg 2 puffs twice daily.  For her rhinitis, she remained on her allergen immunotherapy as well as Allegra, Dymista, and nasal saline rinses.  She continues to avoid shellfish.  Since the last visit, she has done well.  She remains busy at work.  She works for [employer]  She likes her job because she literally does not have to talk to anybody.  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  She just feels that a scorecard and then sends it to the employee supervisor.  Asthma/Respiratory  Symptom History: Asthma is well controlled with the current regimen.  She has never tried to decrease to 1 puff twice daily of the Flovent.  She is open to doing so.  It is not a high co-pay, usually around 30 or $40 per month.  She has not been on prednisone nor she needed to go to the hospital emergency room for her symptoms.  Allergic Rhinitis Symptom History: Allergic rhinitis is under good control with the antihistamine as well as the Dymista.  Allergy shots are going well.  She does feel like they are helping quite a  bit.  Samantha Monroe is on allergen immunotherapy. She receives two injections. Immunotherapy script #1 contains molds, dust mites, and cockroach. She currently receives 0.82m of the RED vial (1/100). Immunotherapy script #2 contains  ragweed, weeds, grasses, cat, and dog. She currently receives 0.524mof the RED vial (1/100). She started shots November of 2021 and reached maintenance in March of 2022.  Food Allergy Symptom History: She continues to avoid shellfish. She does not need a new EpiPen.    Otherwise, there have been no changes to her past medical history, surgical history, family history, or social history.    Review of Systems  Constitutional: Negative.  Negative for chills, fever, malaise/fatigue and weight loss.  HENT: Negative.  Negative for congestion, ear discharge, ear pain and sinus pain.   Eyes:  Negative for pain, discharge and redness.  Respiratory:  Negative for cough, sputum production, shortness of breath and wheezing.   Cardiovascular: Negative.  Negative for chest pain and palpitations.  Gastrointestinal:  Negative for abdominal pain, constipation, diarrhea, heartburn, nausea and vomiting.  Skin: Negative.  Negative for itching and rash.  Neurological:  Negative for dizziness and headaches.  Endo/Heme/Allergies:  Negative for environmental allergies. Does not bruise/bleed easily.      Objective:   Blood pressure 130/82, pulse 77, temperature 98.4 F (36.9 C), resp. rate 18, height 5' 0.75" (1.543 m), weight 166 lb 2 oz (75.4 kg), SpO2 98 %. Body mass index is 31.65 kg/m.    Physical Exam Vitals reviewed.  Constitutional:      Appearance: She is well-developed.     Comments: Delightful and talkative.  HENT:     Head: Normocephalic and atraumatic.     Right Ear: Tympanic membrane, ear canal and external ear normal.     Left Ear: Tympanic membrane, ear canal and external ear normal.     Nose: No nasal deformity, septal deviation, mucosal edema or rhinorrhea.      Right Turbinates: Enlarged, swollen and pale.     Left Turbinates: Enlarged, swollen and pale.     Right Sinus: No maxillary sinus tenderness or frontal sinus tenderness.     Left Sinus: No maxillary sinus tenderness or frontal sinus tenderness.     Mouth/Throat:     Mouth: Mucous membranes are not pale and not dry.     Pharynx: Uvula midline.  Eyes:     General: Lids are normal. No allergic shiner.       Right eye: No discharge.        Left eye: No discharge.     Conjunctiva/sclera: Conjunctivae normal.     Right eye: Right conjunctiva is not injected. No chemosis.    Left eye: Left conjunctiva is not injected. No chemosis.    Pupils: Pupils are equal, round, and reactive to light.  Cardiovascular:     Rate and Rhythm: Normal rate and regular rhythm.     Heart  sounds: Normal heart sounds.  Pulmonary:     Effort: Pulmonary effort is normal. No tachypnea, accessory muscle usage or respiratory distress.     Breath sounds: Normal breath sounds. No wheezing, rhonchi or rales.  Chest:     Chest wall: No tenderness.  Lymphadenopathy:     Cervical: No cervical adenopathy.  Skin:    Coloration: Skin is not pale.     Findings: No abrasion, erythema, petechiae or rash. Rash is not papular, urticarial or vesicular.  Neurological:     Mental Status: She is alert.  Psychiatric:        Behavior: Behavior is cooperative.     Diagnostic studies: none      Salvatore Marvel, MD  [facility]

## 2021-07-18 NOTE — Patient Instructions (Addendum)
1. Moderate persistent asthma without complication - Lung testing not done.  - Let's decrease to one puff twice daily to see how you do.  - If your asthma control worsens, go back to two puffs twice daily.  - Daily controller medication(s): Flovent 160mg 1 puffs twice daily with spacer - Prior to physical activity: albuterol 2 puffs 10-15 minutes before physical activity. - Rescue medications: albuterol 4 puffs every 4-6 hours as needed - Asthma control goals:  * Full participation in all desired activities (may need albuterol before activity) * Albuterol use two time or less a week on average (not counting use with activity) * Cough interfering with sleep two time or less a month * Oral steroids no more than once a year * No hospitalizations  2. Seasonal and perennial allergic rhinitis - Continue with allergy shots at the same schedule. - Continue with Singulair '10mg'$  at night. - Continue with Dymista every morning.  - Continue with shots for a total of 5 years (March 2027).   3. Anaphylactic shock due to food, (shellfish)  - EpiPen is up to date. - Continue with avoidance of shellfish. - Repeating testing declined.   4. Return in about 1 year (around 07/19/2022).    Please inform u[place]of any Emergency Department visits, hospitalizations, or changes in symptoms. Call u[place]abefore going to the ED for breathing or allergy symptoms since we might be able to fit you in for a sick visit. Feel free to contact u[place]anytime with any questions, problems, or concerns.  It was a pleasure to meet you today!    Websites that have reliable patient information: 1. American Academy of Asthma, Allergy, and Immunology: www.aaaai.org 2. Food Allergy Research and Education (FARE): foodallergy.org 3. Mothers of Asthmatics: http://www.asthmacommunitynetwork.org 4. American College of Allergy, Asthma, and Immunology: www.acaai.org   COVID-19 Vaccine Information can be found at:  [email or web address] questions related to vaccine distribution or appointments, please email [email or web address] or call [phone]   We realize that you might be concerned about having an allergic reaction to the COVID19 vaccines. To help with that concern, WE ARE OFFERING THE COVID19 VACCINES IN OUR OFFICE! Ask the front desk for dates!     "Like" u[place]on Facebook and Instagram for our latest updates!      A healthy democracy works best when ANew York Life Insuranceparticipate! Make sure you are registered to vote! If you have moved or changed any of your contact information, you will need to get this updated before voting!  In some cases, you MAY be able to register to vote online: [email or web address]

## 2021-07-19 ENCOUNTER — Encounter: Payer: Self-pay | Admitting: Allergy & Immunology

## 2021-07-19 MED ORDER — ALBUTEROL SULFATE HFA 108 (90 BASE) MCG/ACT IN AERS: INHALATION_SPRAY | RESPIRATORY_TRACT | 1 refills | Status: DC

## 2021-07-19 MED ORDER — FLUTICASONE PROPIONATE HFA 110 MCG/ACT IN AERO: INHALATION_SPRAY | RESPIRATORY_TRACT | 5 refills | Status: DC

## 2021-07-19 MED ORDER — AZELASTINE-FLUTICASONE 137-50 MCG/ACT NA SUSP: 2.0000 | Freq: Two times a day (BID) | NASAL | 5 refills | Status: DC

## 2021-07-25 ENCOUNTER — Ambulatory Visit (INDEPENDENT_AMBULATORY_CARE_PROVIDER_SITE_OTHER): Payer: BC Managed Care – PPO

## 2021-07-25 DIAGNOSIS — J309 Allergic rhinitis, unspecified: Secondary | ICD-10-CM | POA: Diagnosis not present

## 2021-07-30 ENCOUNTER — Ambulatory Visit (INDEPENDENT_AMBULATORY_CARE_PROVIDER_SITE_OTHER): Payer: BC Managed Care – PPO

## 2021-07-30 DIAGNOSIS — J309 Allergic rhinitis, unspecified: Secondary | ICD-10-CM

## 2021-08-08 ENCOUNTER — Ambulatory Visit (INDEPENDENT_AMBULATORY_CARE_PROVIDER_SITE_OTHER): Payer: BC Managed Care – PPO

## 2021-08-08 DIAGNOSIS — J309 Allergic rhinitis, unspecified: Secondary | ICD-10-CM | POA: Diagnosis not present

## 2021-08-11 ENCOUNTER — Other Ambulatory Visit: Payer: Self-pay | Admitting: Family Medicine

## 2021-08-11 DIAGNOSIS — I10 Essential (primary) hypertension: Secondary | ICD-10-CM

## 2021-08-13 ENCOUNTER — Ambulatory Visit (INDEPENDENT_AMBULATORY_CARE_PROVIDER_SITE_OTHER): Payer: BC Managed Care – PPO

## 2021-08-13 DIAGNOSIS — J309 Allergic rhinitis, unspecified: Secondary | ICD-10-CM | POA: Diagnosis not present

## 2021-08-21 ENCOUNTER — Ambulatory Visit (INDEPENDENT_AMBULATORY_CARE_PROVIDER_SITE_OTHER): Payer: BC Managed Care – PPO

## 2021-08-21 DIAGNOSIS — J309 Allergic rhinitis, unspecified: Secondary | ICD-10-CM | POA: Diagnosis not present

## 2021-09-03 ENCOUNTER — Ambulatory Visit (INDEPENDENT_AMBULATORY_CARE_PROVIDER_SITE_OTHER): Payer: BC Managed Care – PPO

## 2021-09-03 DIAGNOSIS — J309 Allergic rhinitis, unspecified: Secondary | ICD-10-CM

## 2021-09-16 ENCOUNTER — Ambulatory Visit (INDEPENDENT_AMBULATORY_CARE_PROVIDER_SITE_OTHER): Payer: BC Managed Care – PPO

## 2021-09-16 DIAGNOSIS — J309 Allergic rhinitis, unspecified: Secondary | ICD-10-CM | POA: Diagnosis not present

## 2021-10-01 ENCOUNTER — Ambulatory Visit (INDEPENDENT_AMBULATORY_CARE_PROVIDER_SITE_OTHER): Payer: BC Managed Care – PPO

## 2021-10-01 DIAGNOSIS — J309 Allergic rhinitis, unspecified: Secondary | ICD-10-CM | POA: Diagnosis not present

## 2021-10-17 ENCOUNTER — Ambulatory Visit (INDEPENDENT_AMBULATORY_CARE_PROVIDER_SITE_OTHER): Payer: BC Managed Care – PPO

## 2021-10-17 DIAGNOSIS — J309 Allergic rhinitis, unspecified: Secondary | ICD-10-CM

## 2021-11-01 ENCOUNTER — Ambulatory Visit (INDEPENDENT_AMBULATORY_CARE_PROVIDER_SITE_OTHER): Payer: BC Managed Care – PPO | Admitting: *Deleted

## 2021-11-01 DIAGNOSIS — J309 Allergic rhinitis, unspecified: Secondary | ICD-10-CM | POA: Diagnosis not present

## 2021-11-04 DIAGNOSIS — J3089 Other allergic rhinitis: Secondary | ICD-10-CM | POA: Diagnosis not present

## 2021-11-05 NOTE — Progress Notes (Signed)
VIALS EXP 11-06-22

## 2021-11-14 ENCOUNTER — Ambulatory Visit (INDEPENDENT_AMBULATORY_CARE_PROVIDER_SITE_OTHER): Payer: BC Managed Care – PPO

## 2021-11-14 DIAGNOSIS — J309 Allergic rhinitis, unspecified: Secondary | ICD-10-CM

## 2021-11-20 ENCOUNTER — Encounter: Payer: Self-pay | Admitting: Family Medicine

## 2021-11-20 ENCOUNTER — Ambulatory Visit: Payer: BC Managed Care – PPO | Admitting: Family Medicine

## 2021-11-20 VITALS — BP 112/80 | HR 89 | Temp 98.2°F | Ht 62.0 in | Wt 157.5 lb

## 2021-11-20 DIAGNOSIS — R1013 Epigastric pain: Secondary | ICD-10-CM | POA: Diagnosis not present

## 2021-11-20 DIAGNOSIS — E785 Hyperlipidemia, unspecified: Secondary | ICD-10-CM

## 2021-11-20 DIAGNOSIS — I10 Essential (primary) hypertension: Secondary | ICD-10-CM | POA: Diagnosis not present

## 2021-11-20 MED ORDER — ONDANSETRON 4 MG PO TBDP: 4.0000 mg | ORAL_TABLET | Freq: Three times a day (TID) | ORAL | 0 refills | Status: DC | PRN

## 2021-11-20 NOTE — Patient Instructions (Addendum)
Keep the diet clean and stay active.  Strong work with your weight loss.  Add some weight resistance exercise to your regimen.   Let me know if this happens again.  Wear your blue light filtering glasses while at work.  Set up with your eye doctor.   Let us know if you need anything.

## 2021-11-20 NOTE — Progress Notes (Signed)
Chief Complaint  Patient presents with   Follow-up   Abdominal Pain    Subjective Samantha Monroe is a 46 y.o. female who presents for hypertension follow up. She does not monitor home blood pressures. She is compliant with medications- Norvasc 5 mg/d. Patient has these side effects of medication: none She is usually adhering to a healthy diet overall. Current exercise: walking No CP or SOB.  Dyslipidemia Patient presents for dyslipidemia follow up. Currently being treated with Crestor 20 mg/d and compliance with treatment thus far has been good. She denies myalgias. Diet/exercise as above.  The patient is not known to have coexisting coronary artery disease.  Abd pain Epigastric abd pain lasting 40-60 min around 1x/week. Hasn't happened for 2 weeks. Started It is sharp and takes her breath away. 8/10 severity. Spontaneously goes away. No apparent trigger/exacerbating factors.    Past Medical History:  Diagnosis Date   Allergy    Anxiety    Asthma    Depression    Dyslipidemia (high LDL; low HDL) 10/12/2015   Easy bruising 10/26/2015   Essential hypertension 10/12/2015   GERD (gastroesophageal reflux disease)    Heart murmur    High cholesterol    High triglycerides    Hypertension    Migraines    Obesity 10/12/2015   Seizures (HCC)    Sleep apnea     Exam BP 112/80 (BP Location: Left Arm, Patient Position: Sitting, Cuff Size: Normal)   Pulse 89   Temp 98.2 F (36.8 C) (Oral)   Ht '5\' 2"'$  (1.575 m)   Wt 157 lb 8 oz (71.4 kg)   SpO2 97%   BMI 28.81 kg/m  General:  well developed, well nourished, in no apparent distress Heart: RRR, no bruits, no LE edema Lungs: clear to auscultation, no accessory muscle use Mouth: MMM Abdomen: Bowel sounds present, soft, nontender, nondistended, no masses or organomegaly, negative Murphy's, McBurney's, Rovsing's, Carnett's Psych: well oriented with normal range of affect and appropriate judgment/insight  Essential  hypertension  Dyslipidemia (high LDL; low HDL)  Epigastric pain - Plan: ondansetron (ZOFRAN-ODT) 4 MG disintegrating tablet  Chronic, stable. Cont Norvasc 5 mg/d. Counseled on diet and exercise. Chronic, stable. Cont Crestor 20 mg/d.  She has not had 2 weeks.  She will monitor specific triggers.  Zofran as needed.  Happens again, we will check an ultrasound of her gallbladder.  Could consider referral as well.  Does not seem like anything sinister. She politely declined the flu shot. F/u in 6 months. The patient voiced understanding and agreement to the plan.  [place], DO 11/20/21  10:31 AM

## 2021-11-26 ENCOUNTER — Ambulatory Visit (INDEPENDENT_AMBULATORY_CARE_PROVIDER_SITE_OTHER): Payer: BC Managed Care – PPO | Admitting: *Deleted

## 2021-11-26 DIAGNOSIS — J309 Allergic rhinitis, unspecified: Secondary | ICD-10-CM | POA: Diagnosis not present

## 2021-12-13 ENCOUNTER — Ambulatory Visit (INDEPENDENT_AMBULATORY_CARE_PROVIDER_SITE_OTHER): Payer: BC Managed Care – PPO

## 2021-12-13 DIAGNOSIS — J309 Allergic rhinitis, unspecified: Secondary | ICD-10-CM | POA: Diagnosis not present

## 2021-12-18 ENCOUNTER — Ambulatory Visit (INDEPENDENT_AMBULATORY_CARE_PROVIDER_SITE_OTHER): Payer: BC Managed Care – PPO

## 2021-12-18 DIAGNOSIS — J309 Allergic rhinitis, unspecified: Secondary | ICD-10-CM

## 2021-12-24 ENCOUNTER — Ambulatory Visit (INDEPENDENT_AMBULATORY_CARE_PROVIDER_SITE_OTHER): Payer: BC Managed Care – PPO

## 2021-12-24 DIAGNOSIS — J309 Allergic rhinitis, unspecified: Secondary | ICD-10-CM | POA: Diagnosis not present

## 2021-12-30 ENCOUNTER — Ambulatory Visit (INDEPENDENT_AMBULATORY_CARE_PROVIDER_SITE_OTHER): Payer: BC Managed Care – PPO | Admitting: *Deleted

## 2021-12-30 DIAGNOSIS — J309 Allergic rhinitis, unspecified: Secondary | ICD-10-CM | POA: Diagnosis not present

## 2022-01-06 ENCOUNTER — Ambulatory Visit (INDEPENDENT_AMBULATORY_CARE_PROVIDER_SITE_OTHER): Payer: BC Managed Care – PPO | Admitting: *Deleted

## 2022-01-06 DIAGNOSIS — J309 Allergic rhinitis, unspecified: Secondary | ICD-10-CM | POA: Diagnosis not present

## 2022-01-07 ENCOUNTER — Other Ambulatory Visit: Payer: Self-pay | Admitting: Family Medicine

## 2022-01-21 ENCOUNTER — Ambulatory Visit (INDEPENDENT_AMBULATORY_CARE_PROVIDER_SITE_OTHER): Payer: BC Managed Care – PPO

## 2022-01-21 DIAGNOSIS — J309 Allergic rhinitis, unspecified: Secondary | ICD-10-CM

## 2022-02-06 DIAGNOSIS — Z6829 Body mass index (BMI) 29.0-29.9, adult: Secondary | ICD-10-CM | POA: Diagnosis not present

## 2022-02-06 DIAGNOSIS — Z1231 Encounter for screening mammogram for malignant neoplasm of breast: Secondary | ICD-10-CM | POA: Diagnosis not present

## 2022-02-06 DIAGNOSIS — Z113 Encounter for screening for infections with a predominantly sexual mode of transmission: Secondary | ICD-10-CM | POA: Diagnosis not present

## 2022-02-06 DIAGNOSIS — Z01419 Encounter for gynecological examination (general) (routine) without abnormal findings: Secondary | ICD-10-CM | POA: Diagnosis not present

## 2022-02-12 ENCOUNTER — Ambulatory Visit (INDEPENDENT_AMBULATORY_CARE_PROVIDER_SITE_OTHER): Payer: BC Managed Care – PPO

## 2022-02-12 ENCOUNTER — Other Ambulatory Visit: Payer: Self-pay | Admitting: Obstetrics and Gynecology

## 2022-02-12 DIAGNOSIS — J309 Allergic rhinitis, unspecified: Secondary | ICD-10-CM

## 2022-02-12 DIAGNOSIS — R928 Other abnormal and inconclusive findings on diagnostic imaging of breast: Secondary | ICD-10-CM

## 2022-02-25 ENCOUNTER — Ambulatory Visit
Admission: RE | Admit: 2022-02-25 | Discharge: 2022-02-25 | Disposition: A | Discharge status: Home / Self Care | Payer: BC Managed Care – PPO | Source: Ambulatory Visit | Attending: Obstetrics and Gynecology | Admitting: Obstetrics and Gynecology

## 2022-02-25 DIAGNOSIS — N6002 Solitary cyst of left breast: Secondary | ICD-10-CM | POA: Diagnosis not present

## 2022-02-25 DIAGNOSIS — R928 Other abnormal and inconclusive findings on diagnostic imaging of breast: Secondary | ICD-10-CM

## 2022-03-05 ENCOUNTER — Ambulatory Visit (INDEPENDENT_AMBULATORY_CARE_PROVIDER_SITE_OTHER): Payer: BC Managed Care – PPO

## 2022-03-05 DIAGNOSIS — J309 Allergic rhinitis, unspecified: Secondary | ICD-10-CM

## 2022-03-27 ENCOUNTER — Ambulatory Visit (INDEPENDENT_AMBULATORY_CARE_PROVIDER_SITE_OTHER): Payer: BC Managed Care – PPO

## 2022-03-27 DIAGNOSIS — J309 Allergic rhinitis, unspecified: Secondary | ICD-10-CM | POA: Diagnosis not present

## 2022-04-14 ENCOUNTER — Ambulatory Visit (INDEPENDENT_AMBULATORY_CARE_PROVIDER_SITE_OTHER): Payer: BC Managed Care – PPO | Admitting: *Deleted

## 2022-04-14 DIAGNOSIS — J309 Allergic rhinitis, unspecified: Secondary | ICD-10-CM | POA: Diagnosis not present

## 2022-04-15 NOTE — Progress Notes (Signed)
VIALS EXP 04-16-23

## 2022-04-16 DIAGNOSIS — J3089 Other allergic rhinitis: Secondary | ICD-10-CM | POA: Diagnosis not present

## 2022-04-17 ENCOUNTER — Other Ambulatory Visit: Payer: Self-pay | Admitting: Allergy & Immunology

## 2022-04-17 ENCOUNTER — Other Ambulatory Visit: Payer: Self-pay | Admitting: Family Medicine

## 2022-05-03 ENCOUNTER — Other Ambulatory Visit: Payer: Self-pay | Admitting: Family Medicine

## 2022-05-03 DIAGNOSIS — I10 Essential (primary) hypertension: Secondary | ICD-10-CM

## 2022-05-06 ENCOUNTER — Ambulatory Visit (INDEPENDENT_AMBULATORY_CARE_PROVIDER_SITE_OTHER): Payer: BC Managed Care – PPO

## 2022-05-06 DIAGNOSIS — J309 Allergic rhinitis, unspecified: Secondary | ICD-10-CM | POA: Diagnosis not present

## 2022-05-11 IMAGING — US US BREAST*L* LIMITED INC AXILLA
1 series · 7 of 7 positions shown · non-contrast
Comparison: Previous exam(s).

CLINICAL DATA: 44-year-old female presenting as a recall from
screening for possible left breast mass.

EXAM:
DIGITAL DIAGNOSTIC LEFT MAMMOGRAM WITH TOMO
ULTRASOUND LEFT BREAST

[Series 1: us breast*left* limited inc axilla · 0.06mm/px · 7 of 7 slices shown]
[im 1/7]
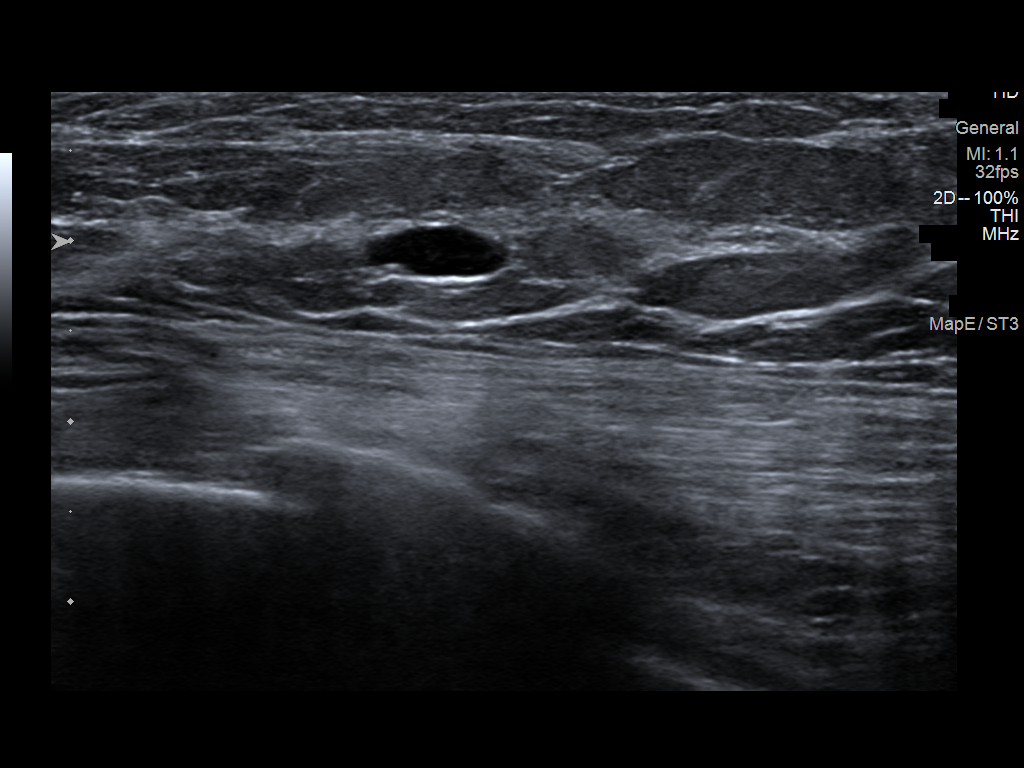
[im 2/7]
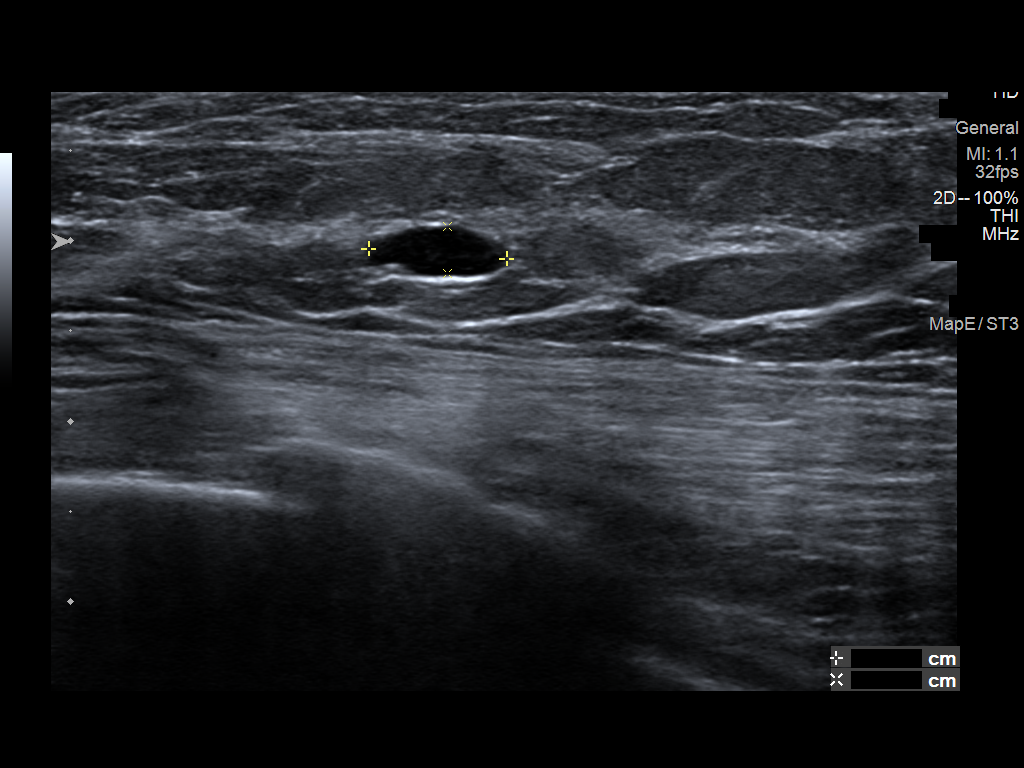
[im 3/7]
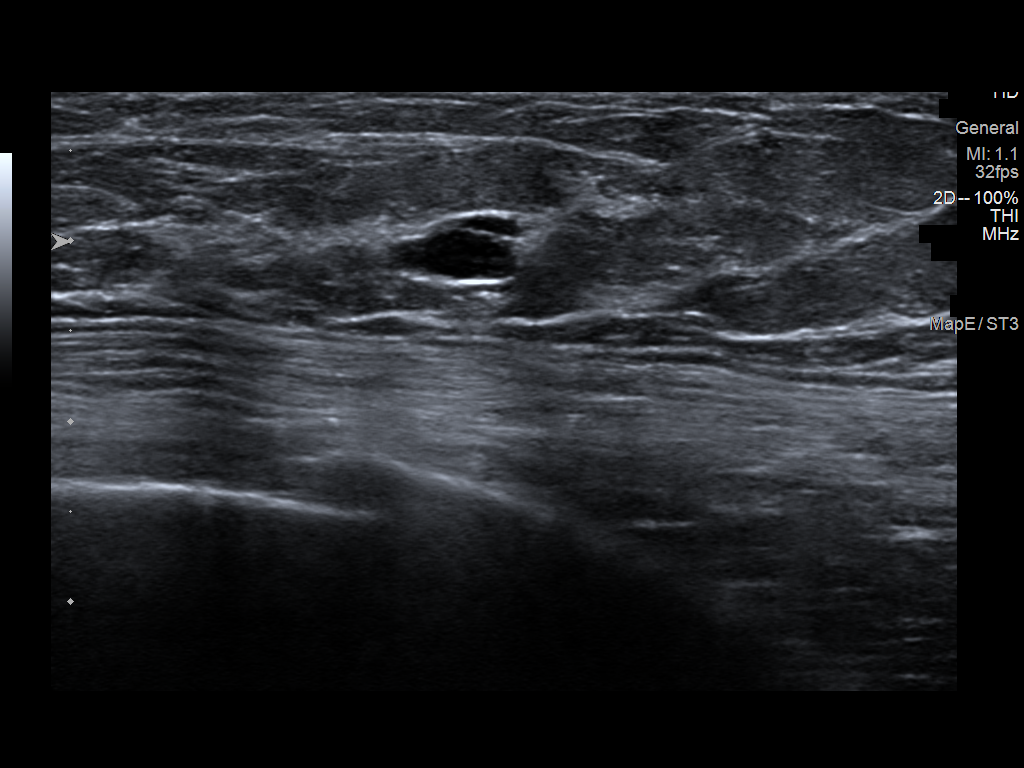
[im 4/7]
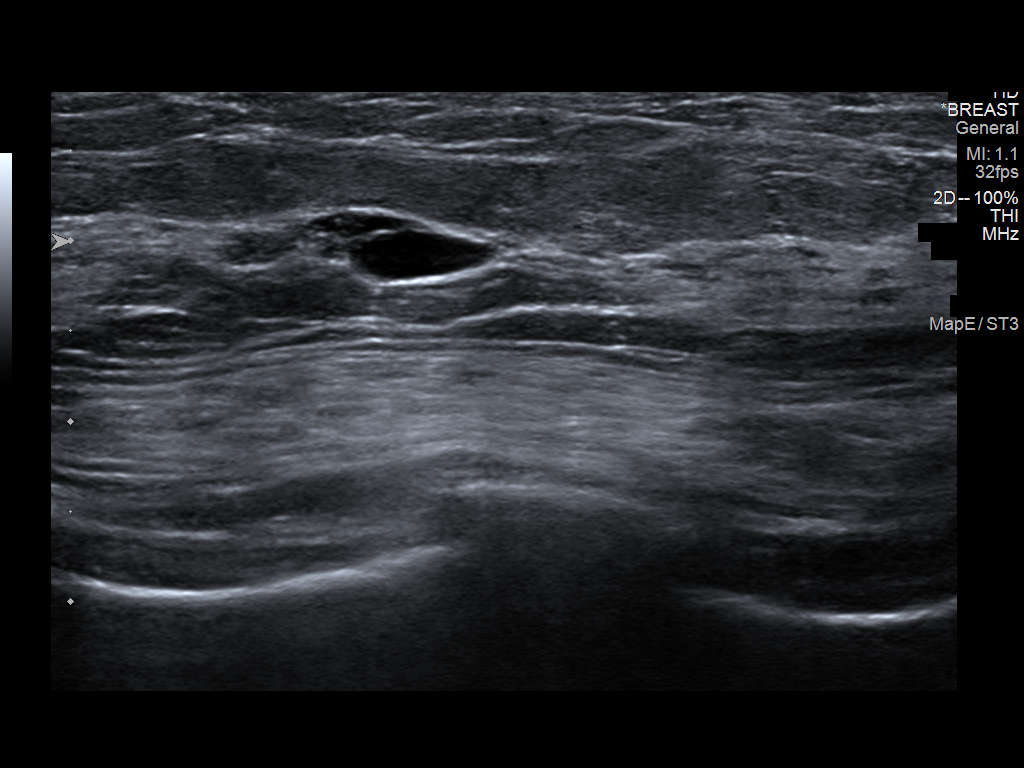
[im 5/7]
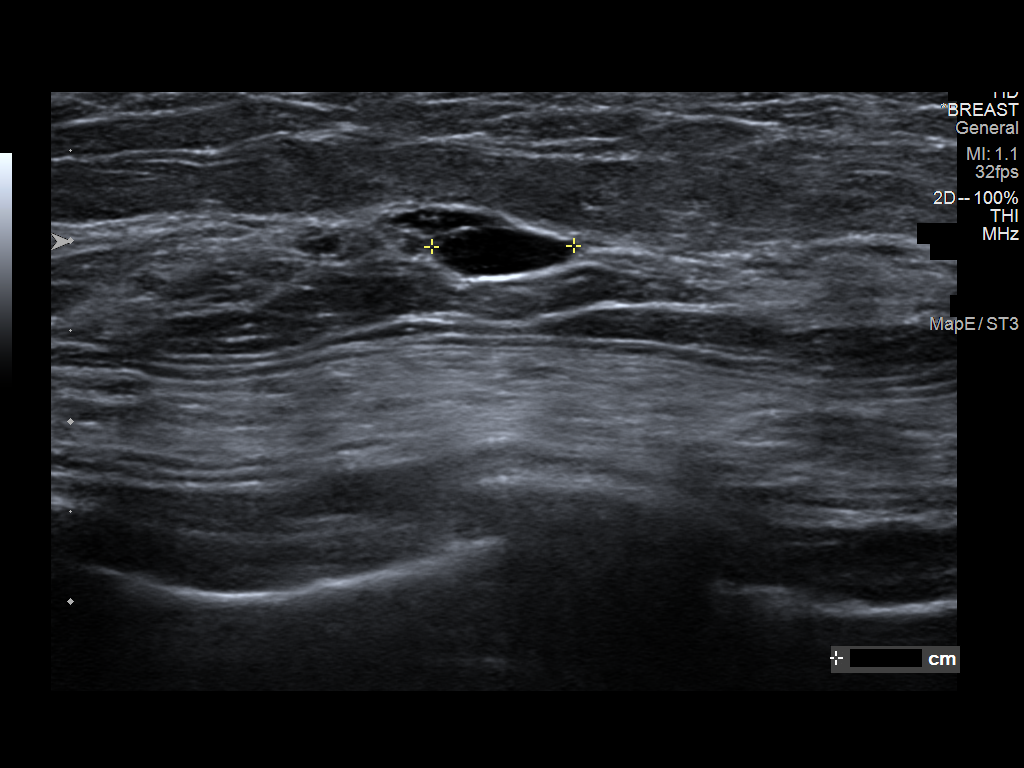
[im 6/7]
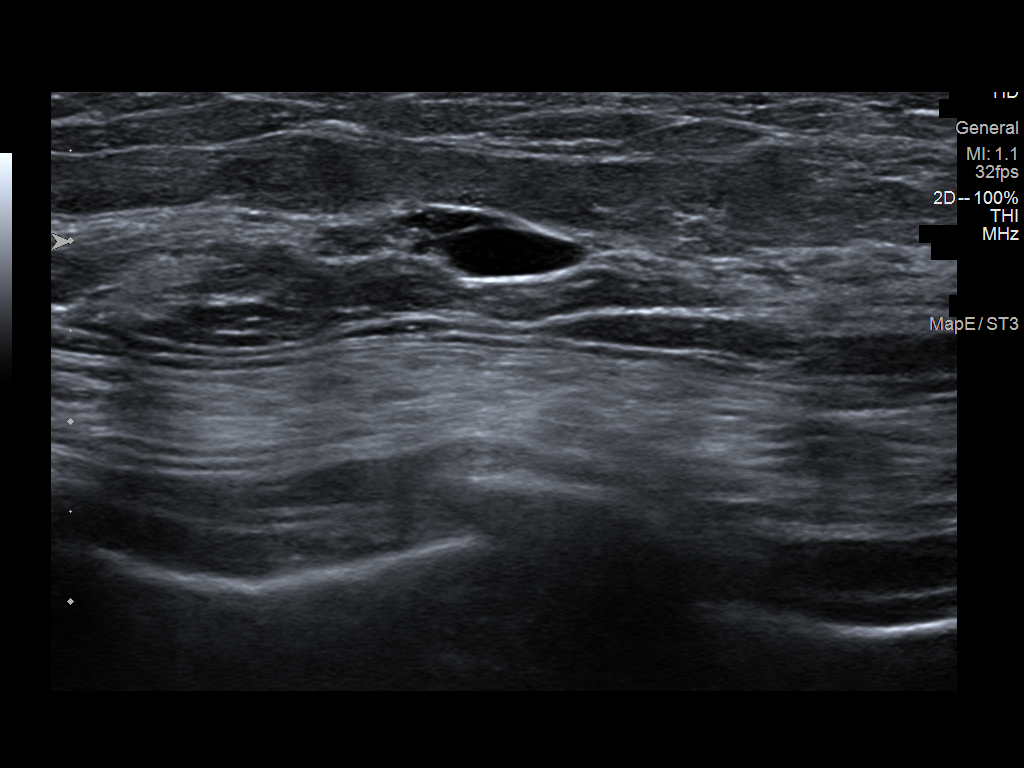
[im 7/7]
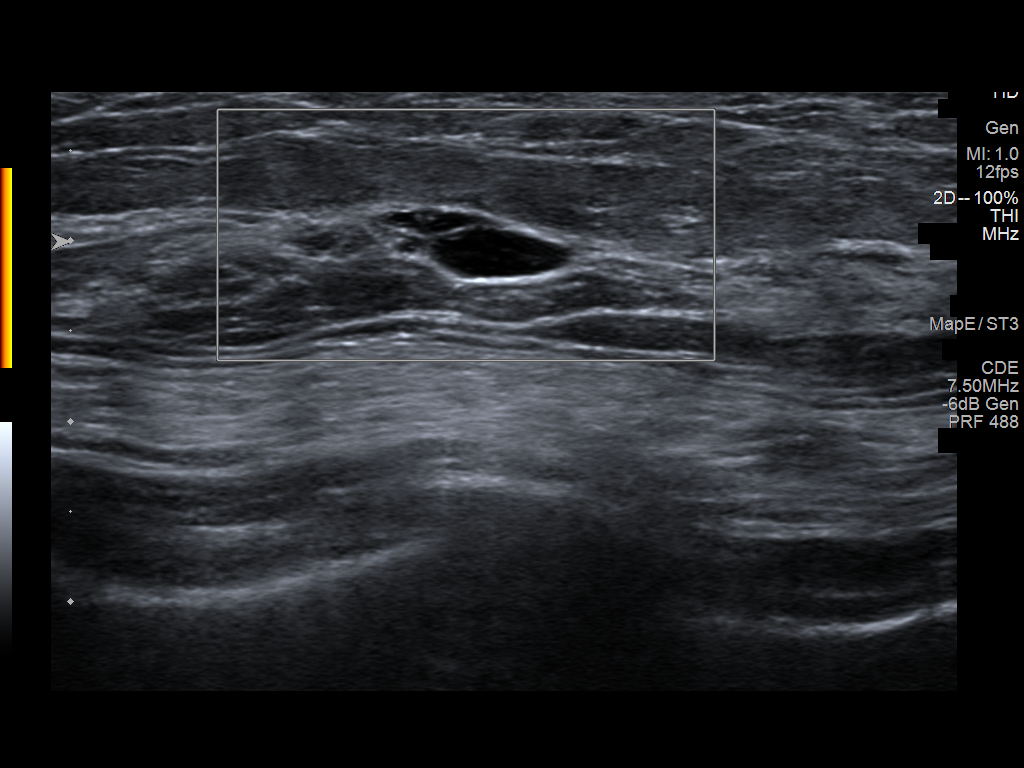

[7 of 7 positions shown; findings below may reference images not displayed]

ACR Breast Density Category c: The breast tissue is heterogeneously
dense, which may obscure small masses.
FINDINGS: Mammogram:

Spot compression tomosynthesis and full field LM views of the left
breast performed. There is persistence of an oval circumscribed mass
in the central superior left breast posterior depth measuring
approximately 0.9 cm. There is an adjacent smaller similar appearing
mass.

Ultrasound:

Targeted ultrasound is performed in the left breast at 12 o'clock 8
cm from the nipple demonstrating an oval circumscribed anechoic mass
measuring 0.8 x 0.3 x 0.8 cm. There is an adjacent smaller similar
appearing mass. These correspond to the mammographic findings. No
internal vascularity.
IMPRESSION: Benign simple cysts in the left breast at 12 o'clock.

RECOMMENDATION:
Screening mammogram in one year.(Code:[id number])

I have discussed the findings and recommendations with the patient.
If applicable, a reminder letter will be sent to the patient
regarding the next appointment.

BI-RADS CATEGORY  2: Benign.

## 2022-05-14 ENCOUNTER — Other Ambulatory Visit: Payer: Self-pay | Admitting: Allergy & Immunology

## 2022-05-14 MED ORDER — PULMICORT FLEXHALER 90 MCG/ACT IN AEPB: 1.0000 | INHALATION_SPRAY | Freq: Two times a day (BID) | RESPIRATORY_TRACT | 5 refills | Status: DC

## 2022-05-19 ENCOUNTER — Other Ambulatory Visit: Payer: Self-pay | Admitting: Allergy & Immunology

## 2022-05-20 ENCOUNTER — Other Ambulatory Visit: Payer: Self-pay | Admitting: Allergy & Immunology

## 2022-05-23 ENCOUNTER — Encounter: Payer: BC Managed Care – PPO | Admitting: Family Medicine

## 2022-06-03 ENCOUNTER — Ambulatory Visit (INDEPENDENT_AMBULATORY_CARE_PROVIDER_SITE_OTHER): Payer: BC Managed Care – PPO

## 2022-06-03 DIAGNOSIS — J309 Allergic rhinitis, unspecified: Secondary | ICD-10-CM | POA: Diagnosis not present

## 2022-06-12 ENCOUNTER — Ambulatory Visit (INDEPENDENT_AMBULATORY_CARE_PROVIDER_SITE_OTHER): Payer: BC Managed Care – PPO

## 2022-06-12 DIAGNOSIS — J309 Allergic rhinitis, unspecified: Secondary | ICD-10-CM

## 2022-06-20 ENCOUNTER — Ambulatory Visit (INDEPENDENT_AMBULATORY_CARE_PROVIDER_SITE_OTHER): Payer: BC Managed Care – PPO

## 2022-06-20 DIAGNOSIS — J309 Allergic rhinitis, unspecified: Secondary | ICD-10-CM

## 2022-06-25 ENCOUNTER — Ambulatory Visit (INDEPENDENT_AMBULATORY_CARE_PROVIDER_SITE_OTHER): Payer: BC Managed Care – PPO | Admitting: *Deleted

## 2022-06-25 DIAGNOSIS — J309 Allergic rhinitis, unspecified: Secondary | ICD-10-CM | POA: Diagnosis not present

## 2022-07-02 ENCOUNTER — Ambulatory Visit (INDEPENDENT_AMBULATORY_CARE_PROVIDER_SITE_OTHER): Payer: BC Managed Care – PPO | Admitting: *Deleted

## 2022-07-02 DIAGNOSIS — J309 Allergic rhinitis, unspecified: Secondary | ICD-10-CM

## 2022-07-09 ENCOUNTER — Encounter: Payer: Self-pay | Admitting: Internal Medicine

## 2022-07-10 ENCOUNTER — Other Ambulatory Visit: Payer: Self-pay | Admitting: Family Medicine

## 2022-07-10 ENCOUNTER — Ambulatory Visit: Payer: BC Managed Care – PPO | Admitting: Family Medicine

## 2022-07-10 ENCOUNTER — Encounter: Payer: Self-pay | Admitting: Family Medicine

## 2022-07-10 VITALS — BP 130/88 | HR 75 | Temp 98.1°F | Ht 62.0 in | Wt 167.1 lb

## 2022-07-10 DIAGNOSIS — J4521 Mild intermittent asthma with (acute) exacerbation: Secondary | ICD-10-CM | POA: Diagnosis not present

## 2022-07-10 MED ORDER — FLUCONAZOLE 150 MG PO TABS: ORAL_TABLET | ORAL | 0 refills | Status: DC

## 2022-07-10 MED ORDER — AZITHROMYCIN 250 MG PO TABS: ORAL_TABLET | ORAL | 0 refills | Status: DC

## 2022-07-10 MED ORDER — PREDNISONE 20 MG PO TABS: 40.0000 mg | ORAL_TABLET | Freq: Every day | ORAL | 0 refills | Status: AC

## 2022-07-10 NOTE — Patient Instructions (Addendum)
Continue to push fluids, practice good hand hygiene, and cover your mouth if you cough.  If you start having fevers, shaking or shortness of breath, seek immediate care.  OK to take Tylenol 1000 mg (2 extra strength tabs) or 975 mg (3 regular strength tabs) every 6 hours as needed.  Wait 2 days and only take the azithromycin if not improving.   Let us know if you need anything.

## 2022-07-10 NOTE — Progress Notes (Signed)
Chief Complaint  Patient presents with   Cough    For 3 weeks     Samantha Monroe here for URI complaints.  Duration: 3 weeks  Associated symptoms: wheezing, shortness of breath, and coughing Denies: sinus congestion, sinus pain, rhinorrhea, itchy watery eyes, ear pain, ear drainage, sore throat, myalgia, and fevers Treatment to date: Mucinex Sick contacts: No +hx of asthma, using inhaler more.   Past Medical History:  Diagnosis Date   Allergy    Anxiety    Asthma    Depression    Dyslipidemia (high LDL; low HDL) 10/12/2015   Easy bruising 10/26/2015   Essential hypertension 10/12/2015   GERD (gastroesophageal reflux disease)    Heart murmur    High cholesterol    High triglycerides    Hypertension    Migraines    Obesity 10/12/2015   Seizures (HCC)    Sleep apnea     Objective BP 130/88 (BP Location: Left Arm, Patient Position: Sitting, Cuff Size: Normal)   Pulse 75   Temp 98.1 F (36.7 C) (Oral)   Ht 5\' 2"  (1.575 m)   Wt 167 lb 2 oz (75.8 kg)   SpO2 96%   BMI 30.57 kg/m  General: Awake, alert, appears stated age HEENT: AT, [place], ears patent b/l and TM's neg, nares patent w/o discharge, pharynx pink and without exudates, MMM Neck: No masses or asymmetry Heart: RRR Lungs: CTAB, no accessory muscle use Psych: Age appropriate judgment and insight, normal mood and affect  Mild intermittent asthma with acute exacerbation - Plan: predniSONE (DELTASONE) 20 MG tablet  Exacerbation of chronic issue. 5 d pred burst 40 mg/d. If no improvement in 2 d, Zpak sent w Diflucan prn. Continue to push fluids, practice good hand hygiene, cover mouth when coughing. F/u prn. If starting to experience fevers, shaking, or worsening shortness of breath, seek immediate care. Pt voiced understanding and agreement to the plan.  Jilda Roche [place], DO 07/10/22 11:10 AM

## 2022-07-14 ENCOUNTER — Other Ambulatory Visit: Payer: Self-pay | Admitting: Internal Medicine

## 2022-07-17 ENCOUNTER — Encounter: Payer: Self-pay | Admitting: Internal Medicine

## 2022-07-18 ENCOUNTER — Other Ambulatory Visit: Payer: Self-pay | Admitting: Allergy & Immunology

## 2022-07-23 ENCOUNTER — Ambulatory Visit (INDEPENDENT_AMBULATORY_CARE_PROVIDER_SITE_OTHER): Payer: BC Managed Care – PPO | Admitting: *Deleted

## 2022-07-23 DIAGNOSIS — J309 Allergic rhinitis, unspecified: Secondary | ICD-10-CM | POA: Diagnosis not present

## 2022-08-07 ENCOUNTER — Other Ambulatory Visit: Payer: Self-pay | Admitting: Allergy & Immunology

## 2022-08-12 ENCOUNTER — Ambulatory Visit (INDEPENDENT_AMBULATORY_CARE_PROVIDER_SITE_OTHER): Payer: BC Managed Care – PPO | Admitting: *Deleted

## 2022-08-12 DIAGNOSIS — J309 Allergic rhinitis, unspecified: Secondary | ICD-10-CM

## 2022-08-20 ENCOUNTER — Other Ambulatory Visit: Payer: Self-pay | Admitting: Allergy & Immunology

## 2022-09-02 DIAGNOSIS — N898 Other specified noninflammatory disorders of vagina: Secondary | ICD-10-CM | POA: Diagnosis not present

## 2022-09-02 DIAGNOSIS — N939 Abnormal uterine and vaginal bleeding, unspecified: Secondary | ICD-10-CM | POA: Diagnosis not present

## 2022-09-09 ENCOUNTER — Ambulatory Visit (INDEPENDENT_AMBULATORY_CARE_PROVIDER_SITE_OTHER): Payer: BC Managed Care – PPO | Admitting: *Deleted

## 2022-09-09 DIAGNOSIS — J309 Allergic rhinitis, unspecified: Secondary | ICD-10-CM | POA: Diagnosis not present

## 2022-09-15 DIAGNOSIS — N924 Excessive bleeding in the premenopausal period: Secondary | ICD-10-CM | POA: Diagnosis not present

## 2022-09-17 ENCOUNTER — Other Ambulatory Visit: Payer: Self-pay | Admitting: Allergy & Immunology

## 2022-09-19 ENCOUNTER — Ambulatory Visit (AMBULATORY_SURGERY_CENTER): Payer: BC Managed Care – PPO | Admitting: *Deleted

## 2022-09-19 VITALS — Ht 62.0 in | Wt 164.0 lb

## 2022-09-19 DIAGNOSIS — Z86010 Personal history of colonic polyps: Secondary | ICD-10-CM

## 2022-09-19 MED ORDER — NA SULFATE-K SULFATE-MG SULF 17.5-3.13-1.6 GM/177ML PO SOLN: 1.0000 | Freq: Once | ORAL | 0 refills | Status: AC

## 2022-09-19 NOTE — Progress Notes (Signed)
Pt's name and DOB verified at the beginning of the pre-visit.  Pt denies any difficulty with ambulating,sitting, laying down or rolling side to side Gave both [facility] main # and MD on call # prior to instructions.  No egg or soy allergy known to patient  No issues known to pt with past sedation with any surgeries or procedures Pt denies having issues being intubated Patient denies ever being intubated Pt has no issues moving head neck or swallowing No FH of Malignant Hyperthermia Pt is not on diet pills Pt is not on home 02  Pt is not on blood thinners  Pt has frequent issues with constipation RN instructed pt to use Miralax per bottles instructions a week before prep days. Pt states they will Pt is not on dialysis Pt denise any abnormal heart rhythms  Pt denies any upcoming cardiac testing Pt encouraged to use to use Singlecare or Goodrx to reduce cost  Patient's chart reviewed by Cathlyn Parsons CNRA prior to pre-visit and patient appropriate for the [facility].  Pre-visit completed and red dot placed by patient's name on their procedure day (on provider's schedule).  . Visit by phone Pt states weight is 164 lb Instructed pt why it is important to and  to call if they have any changes in health or new medications. Directed them to the # given and on instructions.   Pt states they will.  Instructions reviewed with pt and pt states understanding. Instructed to review again prior to procedure. Pt states they will.  Instructions sent by mail with coupon and by my chart

## 2022-09-25 ENCOUNTER — Encounter: Payer: Self-pay | Admitting: Internal Medicine

## 2022-10-06 ENCOUNTER — Ambulatory Visit (INDEPENDENT_AMBULATORY_CARE_PROVIDER_SITE_OTHER): Payer: BC Managed Care – PPO | Admitting: *Deleted

## 2022-10-06 DIAGNOSIS — J309 Allergic rhinitis, unspecified: Secondary | ICD-10-CM

## 2022-10-09 ENCOUNTER — Ambulatory Visit (AMBULATORY_SURGERY_CENTER): Payer: BC Managed Care – PPO | Admitting: Internal Medicine

## 2022-10-09 ENCOUNTER — Encounter: Payer: Self-pay | Admitting: Internal Medicine

## 2022-10-09 VITALS — BP 136/98 | HR 85 | Temp 98.6°F | Resp 11 | Ht 62.0 in | Wt 164.0 lb

## 2022-10-09 DIAGNOSIS — Z86010 Personal history of colonic polyps: Secondary | ICD-10-CM

## 2022-10-09 DIAGNOSIS — D120 Benign neoplasm of cecum: Secondary | ICD-10-CM

## 2022-10-09 DIAGNOSIS — Z1211 Encounter for screening for malignant neoplasm of colon: Secondary | ICD-10-CM | POA: Diagnosis not present

## 2022-10-09 DIAGNOSIS — K5790 Diverticulosis of intestine, part unspecified, without perforation or abscess without bleeding: Secondary | ICD-10-CM

## 2022-10-09 DIAGNOSIS — Z09 Encounter for follow-up examination after completed treatment for conditions other than malignant neoplasm: Secondary | ICD-10-CM | POA: Diagnosis not present

## 2022-10-09 MED ORDER — SODIUM CHLORIDE 0.9 % IV SOLN: 500.0000 mL | INTRAVENOUS | Status: DC

## 2022-10-09 NOTE — Progress Notes (Signed)
Report to PACU, RN, vss, BBS= Clear.  

## 2022-10-09 NOTE — Progress Notes (Signed)
Patient states there have been no changes to medical or surgical history since time of pre-visit. 

## 2022-10-09 NOTE — Progress Notes (Signed)
GASTROENTEROLOGY PROCEDURE H&P NOTE   Primary Care Physician: Sharlene Dory, DO    Reason for Procedure:  History of adenoma of the colon and family history of colon cancer  Plan:    Colonoscopy  Patient is appropriate for endoscopic procedure(s) in the ambulatory ([facility]) setting.  The nature of the procedure, as well as the risks, benefits, and alternatives were carefully and thoroughly reviewed with the patient. Ample time for discussion and questions allowed. The patient understood, was satisfied, and agreed to proceed.     HPI: Samantha Monroe is a 47 y.o. female who presents for surveillance colonoscopy.  Medical history as below.  Tolerated the prep.  No recent chest pain or shortness of breath.  No abdominal pain today.  Past Medical History:  Diagnosis Date   Allergy    Anemia    Anxiety    Asthma    Depression    Dyslipidemia (high LDL; low HDL) 10/12/2015   Easy bruising 10/26/2015   Essential hypertension 10/12/2015   GERD (gastroesophageal reflux disease)    Heart murmur    High cholesterol    High triglycerides    Hypertension    Migraines    Obesity 10/12/2015   Seizures (HCC)     Past Surgical History:  Procedure Laterality Date   APPENDECTOMY     CESAREAN SECTION     3 C sections   DILITATION & CURRETTAGE/HYSTROSCOPY WITH THERMACHOICE ABLATION  01/09/2012   Procedure: DILATATION & CURETTAGE/HYSTEROSCOPY WITH THERMACHOICE ABLATION;  Surgeon: Jeani Hawking, MD;  Location: [facility];  Service: Gynecology;  Laterality: N/A;   TONSILLECTOMY     TUBAL LIGATION      Prior to Admission medications   Medication Sig Start Date End Date Taking? Authorizing Provider  amLODipine (NORVASC) 5 MG tablet TAKE 1 TABLET (5 MG TOTAL) BY MOUTH DAILY. 05/05/22  Yes Sharlene Dory, DO  escitalopram (LEXAPRO) 10 MG tablet Take 10 mg by mouth daily.   Yes [provider]  montelukast (SINGULAIR) 10 MG tablet TAKE 1 TABLET BY MOUTH EVERYDAY  AT BEDTIME 07/10/22  Yes Wendling, Jilda Roche, DO  rosuvastatin (CRESTOR) 20 MG tablet TAKE 1 TABLET BY MOUTH EVERY DAY 04/17/22  Yes Wendling, Jilda Roche, DO  triamterene-hydrochlorothiazide (MAXZIDE) 75-50 MG tablet Take 1 tablet by mouth daily. 12/23/18  Yes [provider]  zolpidem (AMBIEN) 10 MG tablet Take 10 mg by mouth at bedtime as needed. For insomina   Yes [provider]  albuterol (VENTOLIN HFA) 108 (90 Base) MCG/ACT inhaler INHALE 2 PUFFS BY MOUTH EVERY 4 HOURS AS NEEDED FOR WHEEZE OR FOR SHORTNESS OF BREATH 08/07/22   Alfonse Spruce, MD  Azelastine-Fluticasone 137-50 MCG/ACT SUSP PLACE 2 SPRAYS INTO THE NOSE 2 (TWO) TIMES DAILY 04/18/22   Alfonse Spruce, MD  Budesonide [facility]) 90 MCG/ACT inhaler  05/19/22   Alfonse Spruce, MD  Budesonide [facility]) 90 MCG/ACT inhaler INHALE 1 PUFF INTO THE LUNGS TWICE A DAY 07/15/22   Alfonse Spruce, MD  EPINEPHrine (AUVI-Q) 0.3 mg/0.3 mL IJ SOAJ injection Use as directed for severe allergic reaction 12/21/20   Ambs, Norvel Richards, FNP  Ferrous Sulfate (IRON PO) Take by mouth.    [provider]  Multiple Vitamins-Minerals (WOMENS 50+ MULTI VITAMIN PO) Take by mouth.    [provider]  norethindrone (AYGESTIN) 5 MG tablet TAKE 1 TABLET BY MOUTH TWICE A DAY FOR 14 DAYS 09/02/22   [provider]  ondansetron (ZOFRAN-ODT)  4 MG disintegrating tablet Take 1 tablet (4 mg total) by mouth every 8 (eight) hours as needed for nausea or vomiting. Patient not taking: Reported on 09/19/2022 11/20/21   Sharlene Dory, DO    Current Outpatient Medications  Medication Sig Dispense Refill   amLODipine (NORVASC) 5 MG tablet TAKE 1 TABLET (5 MG TOTAL) BY MOUTH DAILY. 90 tablet 2   escitalopram (LEXAPRO) 10 MG tablet Take 10 mg by mouth daily.     montelukast (SINGULAIR) 10 MG tablet TAKE 1 TABLET BY MOUTH EVERYDAY AT BEDTIME 90 tablet 1   rosuvastatin (CRESTOR) 20 MG  tablet TAKE 1 TABLET BY MOUTH EVERY DAY 90 tablet 3   triamterene-hydrochlorothiazide (MAXZIDE) 75-50 MG tablet Take 1 tablet by mouth daily.     zolpidem (AMBIEN) 10 MG tablet Take 10 mg by mouth at bedtime as needed. For insomina     albuterol (VENTOLIN HFA) 108 (90 Base) MCG/ACT inhaler INHALE 2 PUFFS BY MOUTH EVERY 4 HOURS AS NEEDED FOR WHEEZE OR FOR SHORTNESS OF BREATH 18 g 0   Azelastine-Fluticasone 137-50 MCG/ACT SUSP PLACE 2 SPRAYS INTO THE NOSE 2 (TWO) TIMES DAILY 23 g 5   Budesonide (PULMICORT FLEXHALER) 90 MCG/ACT inhaler  1 each 0   Budesonide (PULMICORT FLEXHALER) 90 MCG/ACT inhaler INHALE 1 PUFF INTO THE LUNGS TWICE A DAY 3 each 0   EPINEPHrine (AUVI-Q) 0.3 mg/0.3 mL IJ SOAJ injection Use as directed for severe allergic reaction 2 each 1   Ferrous Sulfate (IRON PO) Take by mouth.     Multiple Vitamins-Minerals (WOMENS 50+ MULTI VITAMIN PO) Take by mouth.     norethindrone (AYGESTIN) 5 MG tablet TAKE 1 TABLET BY MOUTH TWICE A DAY FOR 14 DAYS     ondansetron (ZOFRAN-ODT) 4 MG disintegrating tablet Take 1 tablet (4 mg total) by mouth every 8 (eight) hours as needed for nausea or vomiting. (Patient not taking: Reported on 09/19/2022) 20 tablet 0   Current Facility-Administered Medications  Medication Dose Route Frequency Provider Last Rate Last Admin   0.9 %  sodium chloride infusion  500 mL Intravenous Continuous Birttany Dechellis, Carie Caddy, MD        Allergies as of 10/09/2022 - Review Complete 10/09/2022  Allergen Reaction Noted   Aspirin Other (See Comments) 04/05/2010   Motrin [ibuprofen]  12/29/2011   Shellfish allergy Other (See Comments) 04/05/2010    Family History  Problem Relation Age of Onset   Hypertension Mother    Diabetes Mother        type II   Colon polyps Mother    High Cholesterol Unknown    Hyperlipidemia Brother    Hyperlipidemia Maternal Grandmother    Hyperlipidemia Maternal Grandfather    Asthma Son    Eczema Son    Urticaria Son    Colon cancer Neg Hx     Esophageal cancer Neg Hx    Rectal cancer Neg Hx    Stomach cancer Neg Hx     Social History   Socioeconomic History   Marital status: Divorced    Spouse name: Not on file   Number of children: 3   Years of education: 12th   Highest education level: Not on file  Occupational History   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    Employer: [employer]  Tobacco Use   Smoking status: Never   Smokeless tobacco: Never  Vaping Use   Vaping status: Never Used  Substance and Sexual Activity   Alcohol use: Yes    Comment:  occasional/ 2 or 3 weekly   Drug use: No   Sexual activity: Not on file    Comment: Tubal ligation  Other Topics Concern   Not on file  Social History Narrative   Patient lives at home with children.   Caffeine Use: 1 cup of coffee daily   Social Determinants of Health   Financial Resource Strain: Not on file  Food Insecurity: Not on file  Transportation Needs: Not on file  Physical Activity: Not on file  Stress: Not on file  Social Connections: Not on file  Intimate Partner Violence: Not on file    Physical Exam: Vital signs in last 24 hours: @BP  (!) 153/90   Pulse 75   Temp 98.6 F (37 C) (Skin)   Ht 5\' 2"  (1.575 m)   Wt 164 lb (74.4 kg)   SpO2 98%   BMI 30.00 kg/m  GEN: NAD EYE: Sclerae anicteric ENT: MMM CV: Non-tachycardic Pulm: CTA b/l GI: Soft, NT/ND NEURO:  Alert & Oriented x 3   Erick Blinks, MD [facility]  10/09/2022 10:11 AM

## 2022-10-09 NOTE — Op Note (Signed)
[facility] Patient Name: Samantha Monroe Procedure Date: 10/09/2022 10:13 AM MRN: [id number] Endoscopist: Beverley Fiedler , MD, [id number] Age: 47 Referring MD:  Date of Birth: [birth year] Gender: Female Account #: [id number] Procedure:                Colonoscopy Indications:              High risk colon cancer surveillance: Personal                            history of non-advanced adenoma, Last colonoscopy:                            August 2019 Medicines:                Monitored Anesthesia Care Procedure:                Pre-Anesthesia Assessment:                           - Prior to the procedure, a History and Physical                            was performed, and patient medications and                            allergies were reviewed. The patient's tolerance of                            previous anesthesia was also reviewed. The risks                            and benefits of the procedure and the sedation                            options and risks were discussed with the patient.                            All questions were answered, and informed consent                            was obtained. Prior Anticoagulants: The patient has                            taken no anticoagulant or antiplatelet agents. ASA                            Grade Assessment: II - A patient with mild systemic                            disease. After reviewing the risks and benefits,                            the patient was deemed in satisfactory condition to  undergo the procedure.                           After obtaining informed consent, the colonoscope                            was passed under direct vision. Throughout the                            procedure, the patient's blood pressure, pulse, and                            oxygen saturations were monitored continuously. The                            Olympus CF-HQ190L [id number]) Colonoscope was                             introduced through the anus and advanced to the the                            cecum, identified by appendiceal orifice and                            ileocecal valve. The colonoscopy was performed                            without difficulty. The patient tolerated the                            procedure well. The quality of the bowel                            preparation was good. The ileocecal valve,                            appendiceal orifice, and rectum were photographed. Scope In: 10:25:47 AM Scope Out: 10:43:20 AM Scope Withdrawal Time: 0 hours 12 minutes 14 seconds  Total Procedure Duration: 0 hours 17 minutes 33 seconds  Findings:                 The digital rectal exam was normal.                           A 2 mm polyp was found in the cecum. The polyp was                            sessile. The polyp was removed with a cold biopsy                            forceps. Resection and retrieval were complete.                           Multiple medium-mouthed and small-mouthed  diverticula were found in the sigmoid colon,                            descending colon and hepatic flexure.                           The exam was otherwise without abnormality on                            direct and retroflexion views. Complications:            No immediate complications. Estimated Blood Loss:     Estimated blood loss: none. Impression:               - One 2 mm polyp in the cecum, removed with a cold                            biopsy forceps. Resected and retrieved.                           - Mild diverticulosis in the sigmoid colon, in the                            descending colon and at the hepatic flexure.                           - The examination was otherwise normal on direct                            and retroflexion views. Recommendation:           - Patient has a contact number available for                             emergencies. The signs and symptoms of potential                            delayed complications were discussed with the                            patient. Return to normal activities tomorrow.                            Written discharge instructions were provided to the                            patient.                           - Resume previous diet.                           - Continue present medications.                           - Await pathology results.                           -  Repeat colonoscopy is recommended for                            surveillance. The colonoscopy date will be                            determined after pathology results from today's                            exam become available for review. Beverley Fiedler, MD 10/09/2022 10:45:18 AM This report has been signed electronically.

## 2022-10-09 NOTE — Patient Instructions (Addendum)

## 2022-10-09 NOTE — Progress Notes (Signed)
Called to room to assist during endoscopic procedure.  Patient ID and intended procedure confirmed with present staff. Received instructions for my participation in the procedure from the performing physician.  

## 2022-10-10 ENCOUNTER — Telehealth: Payer: Self-pay | Admitting: *Deleted

## 2022-10-10 NOTE — Telephone Encounter (Signed)
Left message on f/u call 

## 2022-10-16 ENCOUNTER — Encounter: Payer: Self-pay | Admitting: Internal Medicine

## 2022-10-21 ENCOUNTER — Other Ambulatory Visit: Payer: Self-pay | Admitting: Allergy & Immunology

## 2022-10-21 ENCOUNTER — Other Ambulatory Visit: Payer: Self-pay | Admitting: Family Medicine

## 2022-10-29 ENCOUNTER — Ambulatory Visit (INDEPENDENT_AMBULATORY_CARE_PROVIDER_SITE_OTHER): Payer: BC Managed Care – PPO | Admitting: *Deleted

## 2022-10-29 DIAGNOSIS — J309 Allergic rhinitis, unspecified: Secondary | ICD-10-CM | POA: Diagnosis not present

## 2022-11-04 DIAGNOSIS — J3089 Other allergic rhinitis: Secondary | ICD-10-CM | POA: Diagnosis not present

## 2022-11-04 NOTE — Progress Notes (Signed)
VIALS EXP 11-04-23

## 2022-11-19 ENCOUNTER — Other Ambulatory Visit: Payer: Self-pay

## 2022-11-19 ENCOUNTER — Encounter: Payer: Self-pay | Admitting: Family

## 2022-11-19 ENCOUNTER — Ambulatory Visit (INDEPENDENT_AMBULATORY_CARE_PROVIDER_SITE_OTHER): Payer: BC Managed Care – PPO | Admitting: Family

## 2022-11-19 ENCOUNTER — Ambulatory Visit: Payer: Self-pay | Admitting: *Deleted

## 2022-11-19 VITALS — BP 130/80 | HR 78 | Temp 98.2°F | Resp 16 | Wt 167.7 lb

## 2022-11-19 DIAGNOSIS — J4540 Moderate persistent asthma, uncomplicated: Secondary | ICD-10-CM

## 2022-11-19 DIAGNOSIS — J302 Other seasonal allergic rhinitis: Secondary | ICD-10-CM | POA: Diagnosis not present

## 2022-11-19 DIAGNOSIS — J309 Allergic rhinitis, unspecified: Secondary | ICD-10-CM

## 2022-11-19 DIAGNOSIS — J3089 Other allergic rhinitis: Secondary | ICD-10-CM | POA: Diagnosis not present

## 2022-11-19 DIAGNOSIS — T7800XD Anaphylactic reaction due to unspecified food, subsequent encounter: Secondary | ICD-10-CM | POA: Diagnosis not present

## 2022-11-19 MED ORDER — EPINEPHRINE 0.3 MG/0.3ML IJ SOAJ: INTRAMUSCULAR | 1 refills | Status: DC

## 2022-11-19 MED ORDER — PULMICORT FLEXHALER 90 MCG/ACT IN AEPB: 1.0000 | INHALATION_SPRAY | Freq: Two times a day (BID) | RESPIRATORY_TRACT | 1 refills | Status: DC

## 2022-11-19 MED ORDER — MONTELUKAST SODIUM 10 MG PO TABS: 10.0000 mg | ORAL_TABLET | Freq: Every day | ORAL | 1 refills | Status: DC

## 2022-11-19 MED ORDER — AZELASTINE-FLUTICASONE 137-50 MCG/ACT NA SUSP: NASAL | 5 refills | Status: DC

## 2022-11-19 MED ORDER — CETIRIZINE HCL 10 MG PO TABS: ORAL_TABLET | ORAL | 5 refills | Status: DC

## 2022-11-19 MED ORDER — ALBUTEROL SULFATE HFA 108 (90 BASE) MCG/ACT IN AERS: INHALATION_SPRAY | RESPIRATORY_TRACT | 1 refills | Status: DC

## 2022-11-19 NOTE — Patient Instructions (Addendum)
1. Moderate persistent asthma without complication - Daily controller medication(s): Restart Pulmicort 90 mcg 1 puffs twice daily with spacer. Rinse mouth out after. - Prior to physical activity: albuterol 2 puffs 10-15 minutes before physical activity. - Rescue medications: albuterol 4 puffs every 4-6 hours as needed  - Asthma control goals:  * Full participation in all desired activities (may need albuterol before activity) * Albuterol use two time or less a week on average (not counting use with activity) * Cough interfering with sleep two time or less a month * Oral steroids no more than once a year * No hospitalizations  2. Seasonal and perennial allergic rhinitis - Continue with allergy shots at the same schedule. - Continue with Singulair 10mg  at night. - Continue with Dymista every morning.  - Continue with shots for a total of 5 years (March 2027).  - Stop Benadryl - Start Zyrtec (cetirizine) 10 mg once a day as needed for runny nose/itching  3. Anaphylactic shock due to food, (shellfish)  - EpiPen refill sent - Continue with avoidance of shellfish. - Repeating testing declined.   4. Schedule a follow up appointment in 6 months or sooner if needed

## 2022-11-19 NOTE — Progress Notes (Signed)
[address] [place] [address] Dept: [phone]  FOLLOW UP NOTE  Patient ID: Samantha Monroe, female    DOB: [birth year]  Age: 47 y.o. MRN: [id number] Date of Office Visit: 11/19/2022  Assessment  Chief Complaint: Medication Refill  HPI Samantha Monroe is a 47 year old female who presents today for refills.  She was last seen on August 17, 2021 by Dr. Dellis Anes for moderate persistent asthma, seasonal and perennial allergic rhinitis, and anaphylactic shock due to food.  She reports since her last office visit she had a colonoscopy this past August and does not have to go back for 10 years.  Moderate persistent asthma: She reports that she has not been using Pulmicort 90 mcg 1 puff twice a day because she has been out.  She knows that she needs to start back this medication because it did help.  She denies cough, wheeze, tightness in chest, shortness of breath, and nocturnal awakenings due to breathing problems.  Since her last office visit she has not required any systemic steroids or made any trips to the emergency room or urgent care due to breathing problems.  She reports that she does not use her albuterol inhaler even monthly.  She wonders if she lost or ran out of her previous albuterol inhaler.  Seasonal and perennial allergic rhinitis: She reports rhinorrhea and nasal congestion here and there.  She denies postnasal drip.  She does occasionally sneeze with weather changes, but it is not bad.  She does feel like her allergy injections help.  Sometimes she will have large local reactions.  She continues to take Singulair 10 mg once a day, Dymista nasal spray as needed, and Benadryl as needed if sneezing.  She uses Benadryl because it is cheaper.  She has not had any sinus infections since we last saw her.  Anaphylactic shock due to food: She continues to avoid shellfish without any accidental ingestion or use of her epinephrine autoinjector device.  She reports that when she was 47  years old she had throat swelling with shellfish.  She is not interested in updating her skin testing to shellfish.   Drug Allergies:  Allergies  Allergen Reactions   Aspirin Other (See Comments)    Eye redness, felt like throat was closing.   Motrin [Ibuprofen]     Felt like throat was closing   Shellfish Allergy Other (See Comments)    Felt like throat was closing    Review of Systems: Negative except as per HPI   Physical Exam: BP 130/80   Pulse 78   Temp 98.2 F (36.8 C) (Temporal)   Resp 16   Wt 167 lb 11.2 oz (76.1 kg)   SpO2 97%   BMI 30.67 kg/m    Physical Exam Constitutional:      Appearance: Normal appearance.  HENT:     Head: Normocephalic and atraumatic.     Comments: Pharynx normal, eyes normal, ears normal, nose: Bilateral lower turbinates mildly edematous with no drainage noted    Right Ear: Tympanic membrane, ear canal and external ear normal.     Left Ear: Tympanic membrane, ear canal and external ear normal.     Mouth/Throat:     Mouth: Mucous membranes are moist.     Pharynx: Oropharynx is clear.  Eyes:     Conjunctiva/sclera: Conjunctivae normal.  Cardiovascular:     Rate and Rhythm: Regular rhythm.     Heart sounds: Normal heart sounds.  Pulmonary:  Effort: Pulmonary effort is normal.     Breath sounds: Normal breath sounds.     Comments: Lungs clear to auscultation Musculoskeletal:     Cervical back: Neck supple.  Skin:    General: Skin is warm.  Neurological:     Mental Status: She is alert and oriented to person, place, and time.  Psychiatric:        Mood and Affect: Mood normal.        Behavior: Behavior normal.        Thought Content: Thought content normal.        Judgment: Judgment normal.     Diagnostics: FVC 3.35 L (120%), FEV1 2.53 L (112%), FEV1/FVC 0.76.  Spirometry indicates normal spirometry.  Assessment and Plan: 1. Seasonal and perennial allergic rhinitis   2. Moderate persistent asthma without complication    3. Anaphylactic shock due to food, subsequent encounter     No orders of the defined types were placed in this encounter.   Patient Instructions  1. Moderate persistent asthma without complication - Daily controller medication(s): Restart Pulmicort 90 mcg 1 puffs twice daily with spacer. Rinse mouth out after. - Prior to physical activity: albuterol 2 puffs 10-15 minutes before physical activity. - Rescue medications: albuterol 4 puffs every 4-6 hours as needed  - Asthma control goals:  * Full participation in all desired activities (may need albuterol before activity) * Albuterol use two time or less a week on average (not counting use with activity) * Cough interfering with sleep two time or less a month * Oral steroids no more than once a year * No hospitalizations  2. Seasonal and perennial allergic rhinitis - Continue with allergy shots at the same schedule. - Continue with Singulair 10mg  at night. - Continue with Dymista every morning.  - Continue with shots for a total of 5 years (March 2027).  - Stop Benadryl - Start Zyrtec (cetirizine) 10 mg once a day as needed for runny nose/itching  3. Anaphylactic shock due to food, (shellfish)  - EpiPen refill sent - Continue with avoidance of shellfish. - Repeating testing declined.   4. Schedule a follow up appointment in 6 months or sooner if needed           Return in about 6 months (around 05/20/2023), or if symptoms worsen or fail to improve.    Thank you for the opportunity to care for this patient.  Please do not hesitate to contact me with questions.  Nehemiah Settle, FNP [facility]

## 2022-12-16 ENCOUNTER — Ambulatory Visit (INDEPENDENT_AMBULATORY_CARE_PROVIDER_SITE_OTHER): Payer: Self-pay | Admitting: *Deleted

## 2022-12-16 DIAGNOSIS — J309 Allergic rhinitis, unspecified: Secondary | ICD-10-CM

## 2022-12-22 DIAGNOSIS — D259 Leiomyoma of uterus, unspecified: Secondary | ICD-10-CM | POA: Diagnosis not present

## 2022-12-23 ENCOUNTER — Ambulatory Visit (INDEPENDENT_AMBULATORY_CARE_PROVIDER_SITE_OTHER): Payer: Self-pay | Admitting: *Deleted

## 2022-12-23 DIAGNOSIS — J309 Allergic rhinitis, unspecified: Secondary | ICD-10-CM | POA: Diagnosis not present

## 2022-12-30 ENCOUNTER — Ambulatory Visit (INDEPENDENT_AMBULATORY_CARE_PROVIDER_SITE_OTHER): Payer: BC Managed Care – PPO | Admitting: *Deleted

## 2022-12-30 DIAGNOSIS — J309 Allergic rhinitis, unspecified: Secondary | ICD-10-CM | POA: Diagnosis not present

## 2022-12-31 ENCOUNTER — Other Ambulatory Visit: Payer: Self-pay | Admitting: Obstetrics and Gynecology

## 2022-12-31 DIAGNOSIS — N920 Excessive and frequent menstruation with regular cycle: Secondary | ICD-10-CM | POA: Diagnosis not present

## 2022-12-31 DIAGNOSIS — N838 Other noninflammatory disorders of ovary, fallopian tube and broad ligament: Secondary | ICD-10-CM | POA: Diagnosis not present

## 2022-12-31 DIAGNOSIS — Z9851 Tubal ligation status: Secondary | ICD-10-CM | POA: Diagnosis not present

## 2022-12-31 DIAGNOSIS — R102 Pelvic and perineal pain: Secondary | ICD-10-CM | POA: Diagnosis not present

## 2022-12-31 DIAGNOSIS — N8003 Adenomyosis of the uterus: Secondary | ICD-10-CM | POA: Diagnosis not present

## 2022-12-31 DIAGNOSIS — D251 Intramural leiomyoma of uterus: Secondary | ICD-10-CM | POA: Diagnosis not present

## 2022-12-31 DIAGNOSIS — N72 Inflammatory disease of cervix uteri: Secondary | ICD-10-CM | POA: Diagnosis not present

## 2022-12-31 DIAGNOSIS — D259 Leiomyoma of uterus, unspecified: Secondary | ICD-10-CM | POA: Diagnosis not present

## 2023-01-02 LAB — SURGICAL PATHOLOGY

## 2023-01-07 DIAGNOSIS — Z4889 Encounter for other specified surgical aftercare: Secondary | ICD-10-CM | POA: Diagnosis not present

## 2023-01-19 ENCOUNTER — Other Ambulatory Visit: Payer: Self-pay | Admitting: Family

## 2023-01-26 ENCOUNTER — Ambulatory Visit (INDEPENDENT_AMBULATORY_CARE_PROVIDER_SITE_OTHER): Payer: BC Managed Care – PPO | Admitting: *Deleted

## 2023-01-26 DIAGNOSIS — J309 Allergic rhinitis, unspecified: Secondary | ICD-10-CM | POA: Diagnosis not present

## 2023-02-14 ENCOUNTER — Other Ambulatory Visit: Payer: Self-pay | Admitting: Family Medicine

## 2023-02-14 DIAGNOSIS — I10 Essential (primary) hypertension: Secondary | ICD-10-CM

## 2023-02-24 ENCOUNTER — Ambulatory Visit (INDEPENDENT_AMBULATORY_CARE_PROVIDER_SITE_OTHER): Payer: BC Managed Care – PPO | Admitting: *Deleted

## 2023-02-24 DIAGNOSIS — J309 Allergic rhinitis, unspecified: Secondary | ICD-10-CM | POA: Diagnosis not present

## 2023-03-16 ENCOUNTER — Other Ambulatory Visit: Payer: Self-pay | Admitting: Family Medicine

## 2023-03-24 ENCOUNTER — Ambulatory Visit (INDEPENDENT_AMBULATORY_CARE_PROVIDER_SITE_OTHER): Payer: Self-pay

## 2023-03-24 DIAGNOSIS — J309 Allergic rhinitis, unspecified: Secondary | ICD-10-CM

## 2023-04-20 ENCOUNTER — Ambulatory Visit (INDEPENDENT_AMBULATORY_CARE_PROVIDER_SITE_OTHER): Admitting: *Deleted

## 2023-04-20 DIAGNOSIS — J309 Allergic rhinitis, unspecified: Secondary | ICD-10-CM | POA: Diagnosis not present

## 2023-04-23 DIAGNOSIS — Z713 Dietary counseling and surveillance: Secondary | ICD-10-CM | POA: Diagnosis not present

## 2023-04-23 DIAGNOSIS — Z6831 Body mass index (BMI) 31.0-31.9, adult: Secondary | ICD-10-CM | POA: Diagnosis not present

## 2023-05-11 ENCOUNTER — Other Ambulatory Visit: Payer: Self-pay | Admitting: Family Medicine

## 2023-05-11 IMAGING — MG MM DIGITAL DIAGNOSTIC UNILAT*R* W/ TOMO W/ CAD
4 series · 4 of 12 positions shown · non-contrast
Comparison: Previous exam(s).

CLINICAL DATA: 45-year-old female for further evaluation of
possible RIGHT breast asymmetry and possible abnormal RIGHT axillary
lymph nodes on screening mammogram.

EXAM:
DIGITAL DIAGNOSTIC UNILATERAL RIGHT MAMMOGRAM WITH TOMOSYNTHESIS AND
CAD; ULTRASOUND RIGHT BREAST LIMITED
TECHNIQUE: Right digital diagnostic mammography and breast tomosynthesis was
performed. The images were evaluated with computer-aided detection.;
Targeted ultrasound examination of the right breast was performed

[R CC synth-2D]
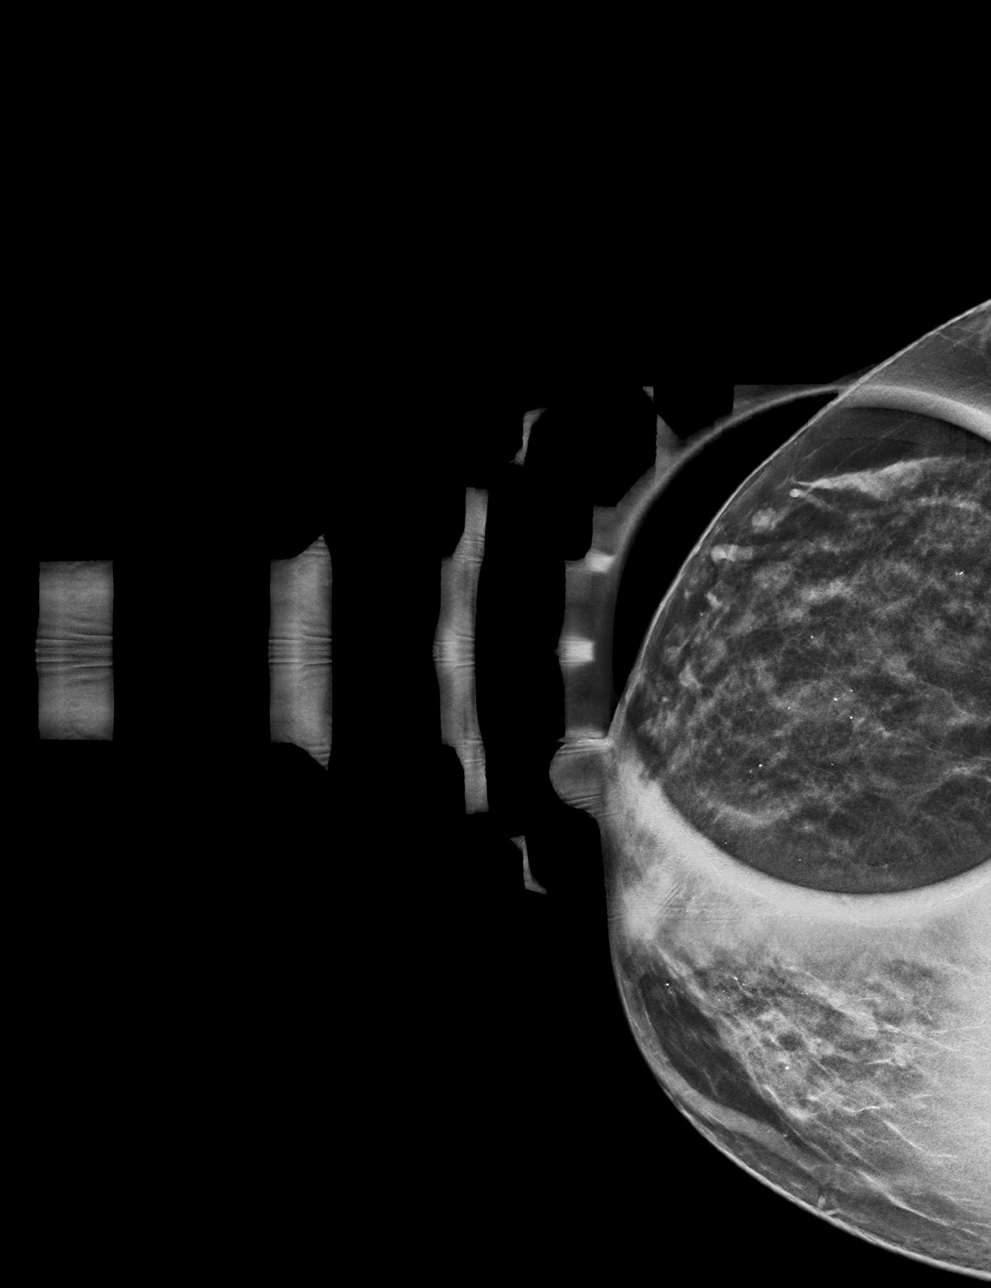

[R ML synth-2D]
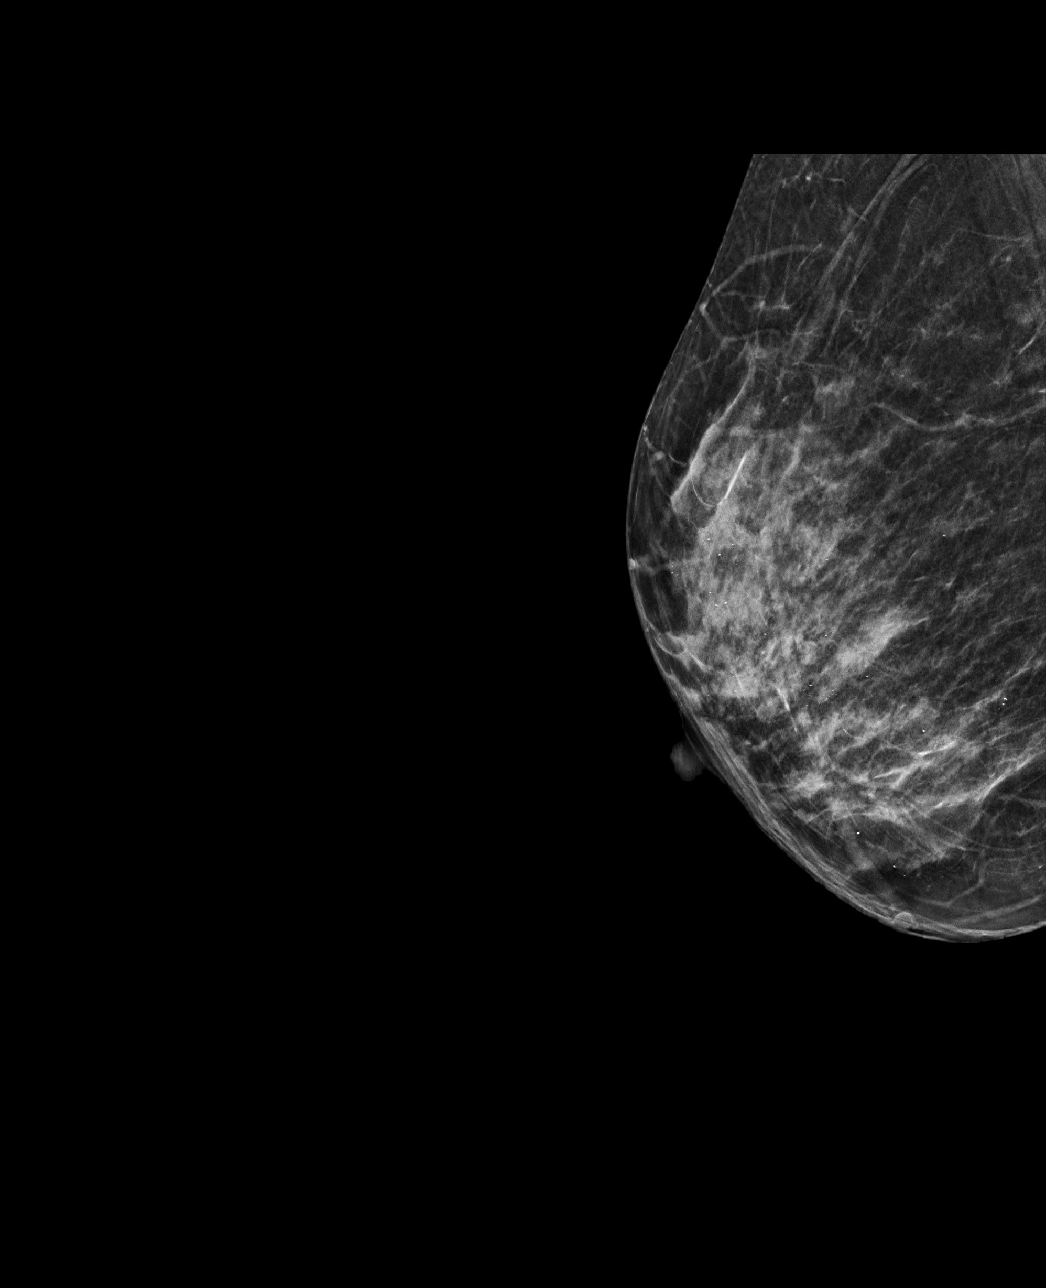

[R ML tomo · tomo slice 27/52.0]
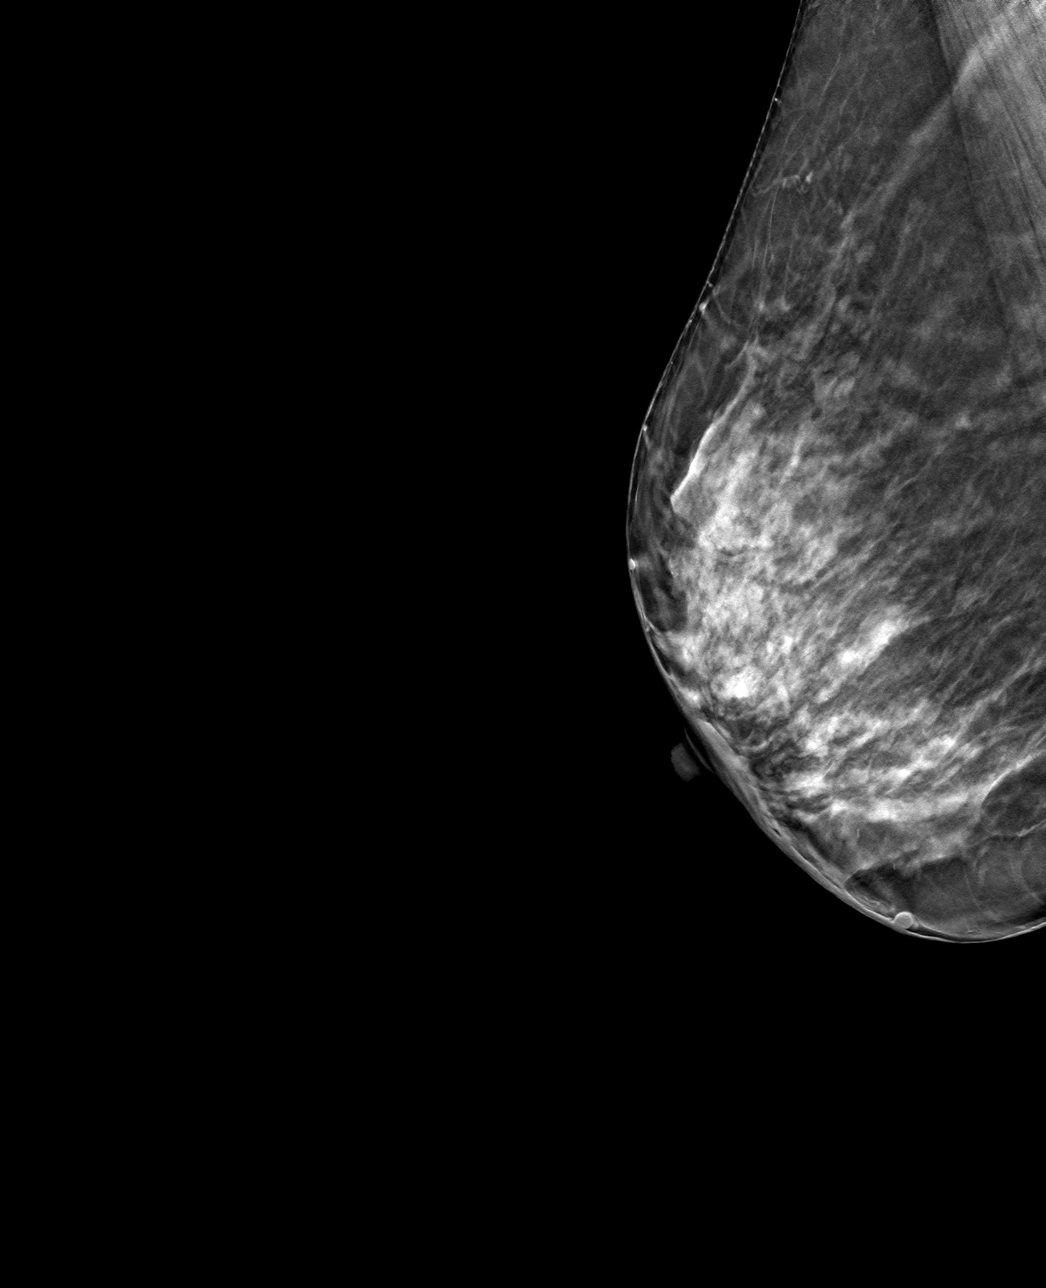

[R CC tomo · tomo slice 20/39.0]
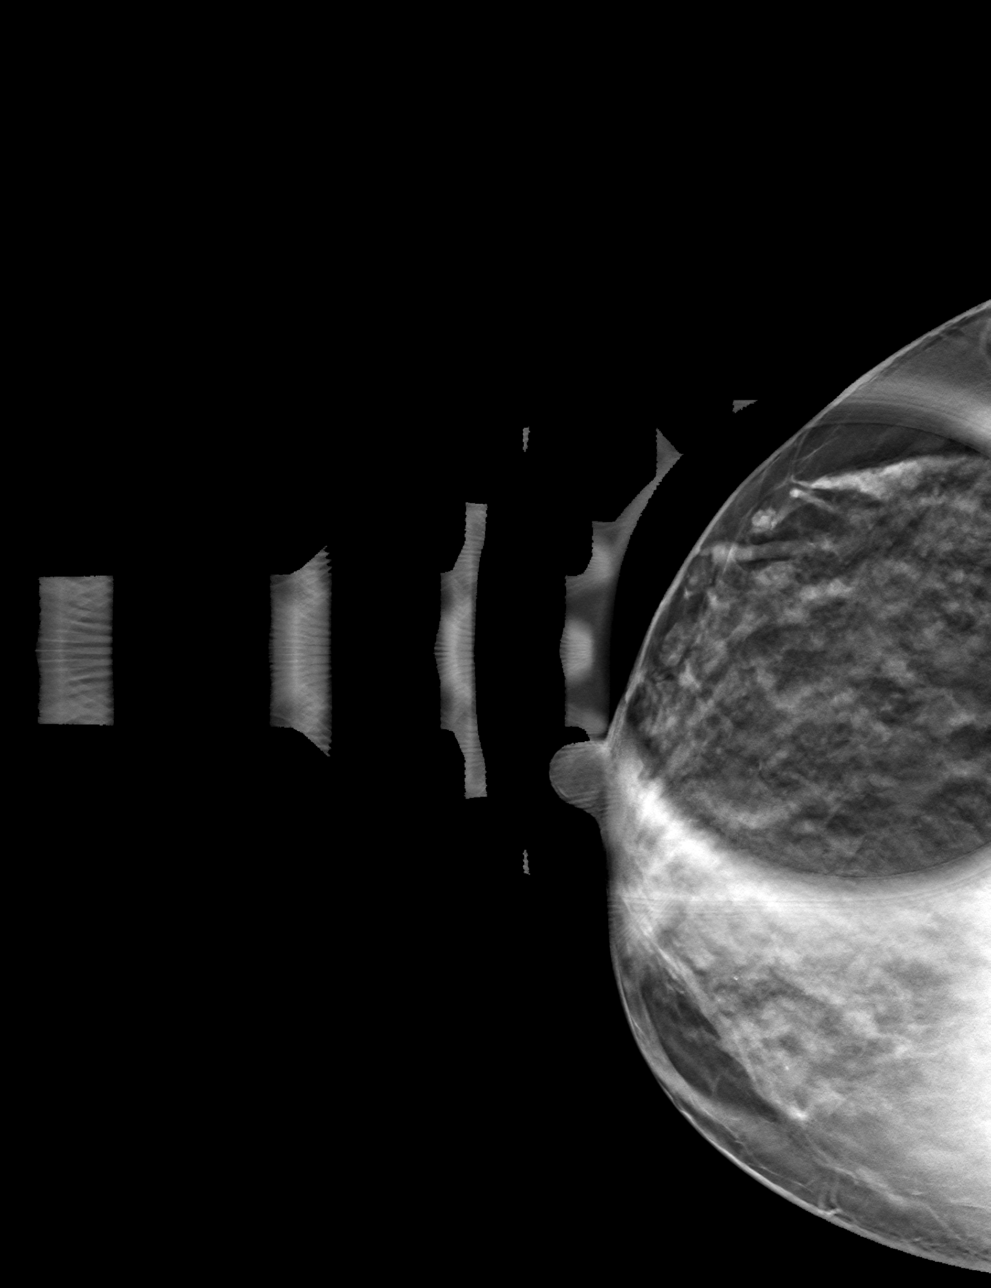

[4 of 12 positions shown; findings below may reference images not displayed]

ACR Breast Density Category c: The breast tissue is heterogeneously
dense, which may obscure small masses.
FINDINGS: 2D/3D full field and spot compression views of the RIGHT breast
demonstrate no persistent suspicious abnormality at the site of the
OUTER RIGHT breast screening study asymmetry, but heterogeneously
dense breast tissue in this area slightly decreases sensitivity.

Targeted ultrasound is performed, showing no sonographic abnormality
within the RIGHT breast in the area of the screening study
asymmetry.

No abnormal RIGHT axillary lymph nodes are identified.
IMPRESSION: 1. No persistent mammographic or sonographic abnormality at the site
of the RIGHT breast screening study finding. No abnormal RIGHT
axillary lymph nodes.

RECOMMENDATION:
Bilateral screening mammogram in 1 year.

I have discussed the findings and recommendations with the patient.
If applicable, a reminder letter will be sent to the patient
regarding the next appointment.

BI-RADS CATEGORY  1: Negative.

## 2023-05-11 IMAGING — US US BREAST*R* LIMITED INC AXILLA
1 series · 10 of 10 positions shown · non-contrast
Comparison: Previous exam(s).

CLINICAL DATA: 45-year-old female for further evaluation of
possible RIGHT breast asymmetry and possible abnormal RIGHT axillary
lymph nodes on screening mammogram.

EXAM:
DIGITAL DIAGNOSTIC UNILATERAL RIGHT MAMMOGRAM WITH TOMOSYNTHESIS AND
CAD; ULTRASOUND RIGHT BREAST LIMITED
TECHNIQUE: Right digital diagnostic mammography and breast tomosynthesis was
performed. The images were evaluated with computer-aided detection.;
Targeted ultrasound examination of the right breast was performed

[Series 1: us breast*right* limited inc axilla · 0.06mm/px · 10 of 10 slices shown]
[im 1/10]
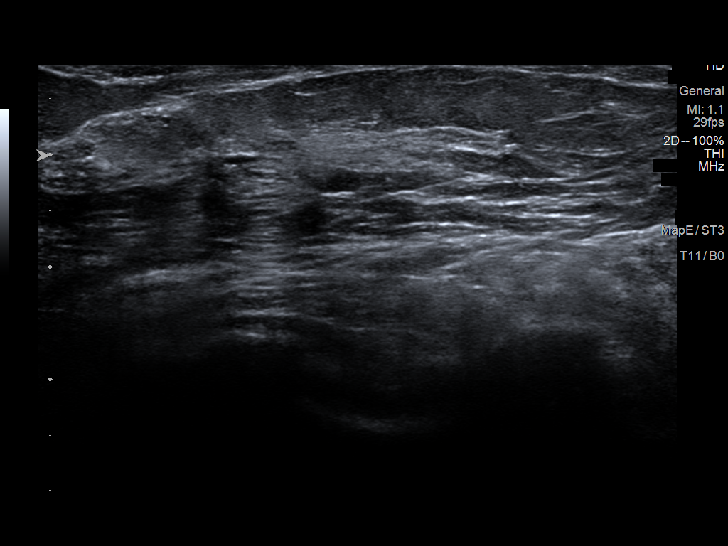
[im 2/10]
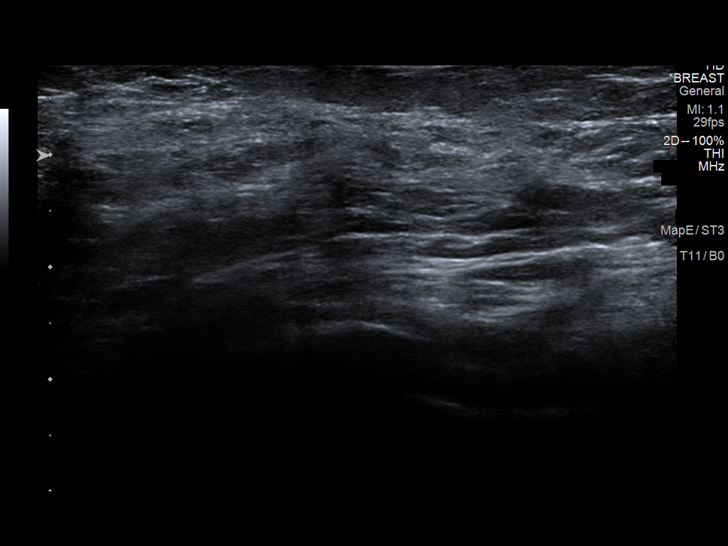
[im 3/10]
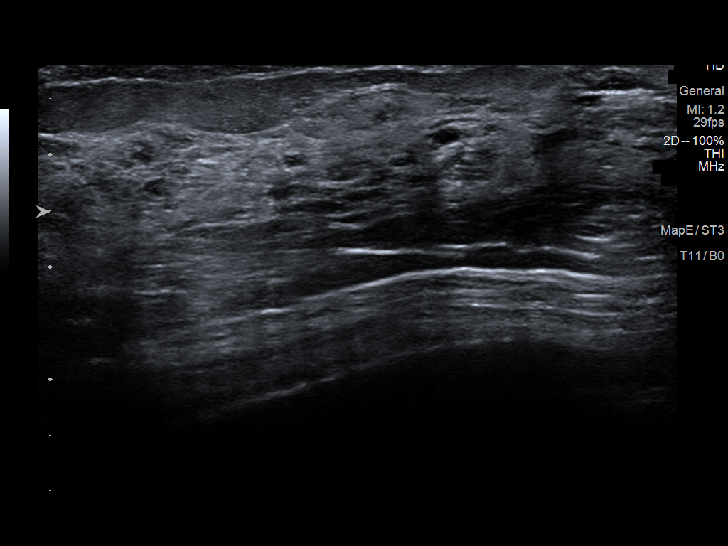
[im 4/10]
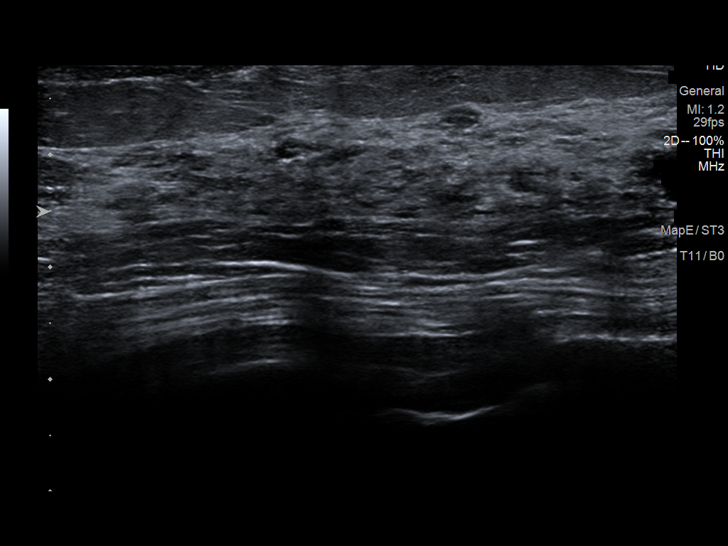
[im 5/10]
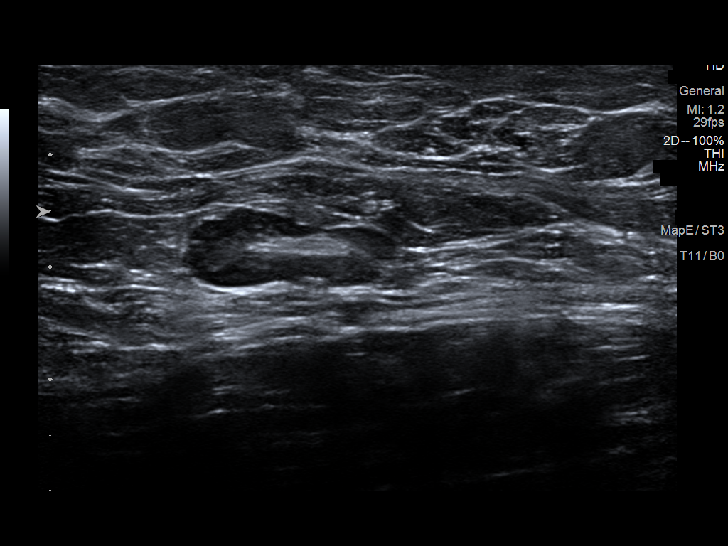
[im 6/10]
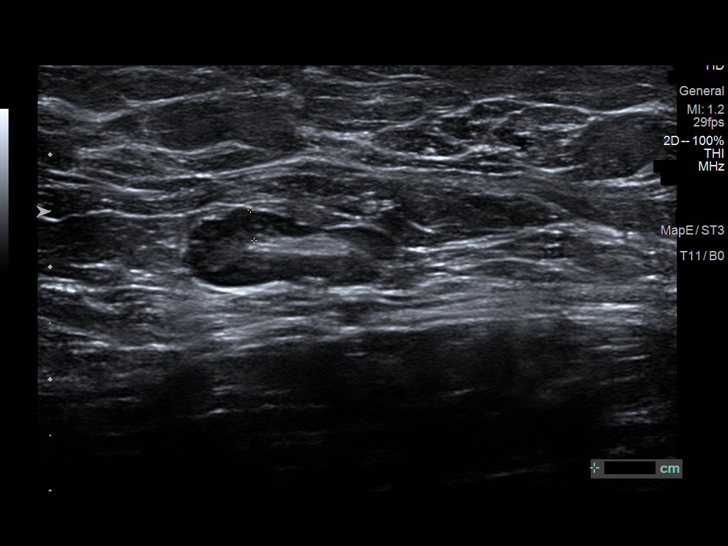
[im 7/10]
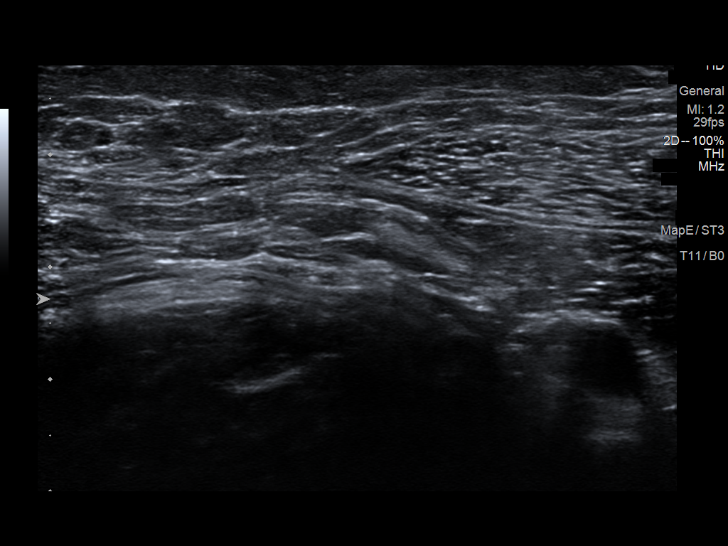
[im 8/10]
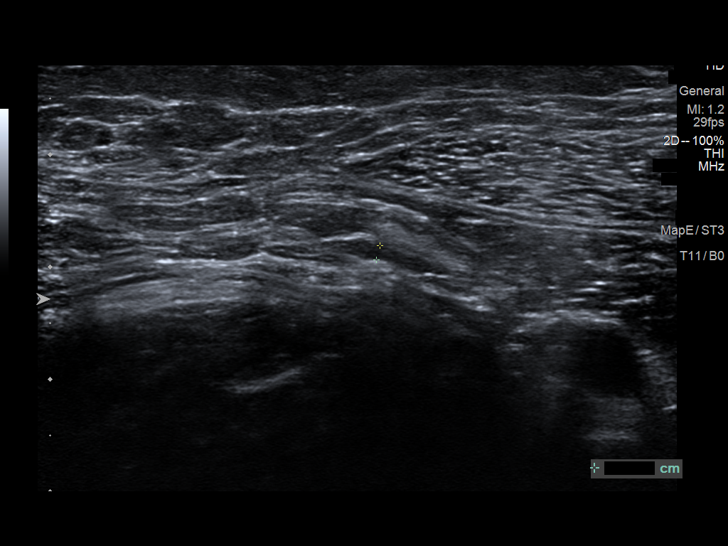
[im 9/10]
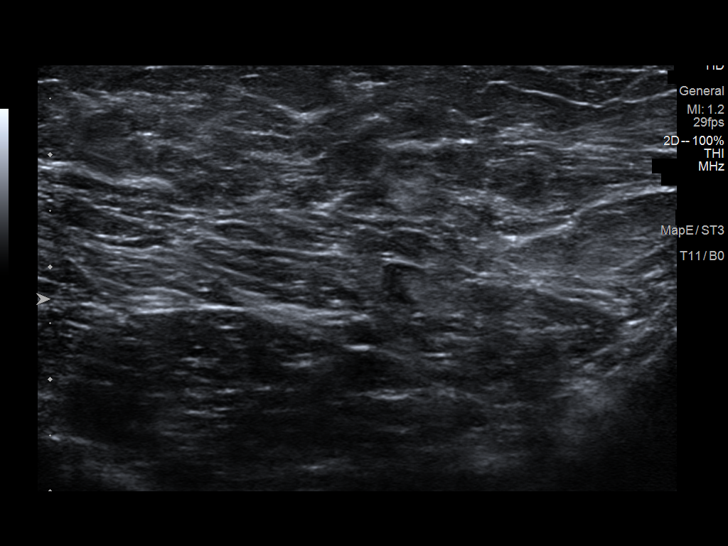
[im 10/10]
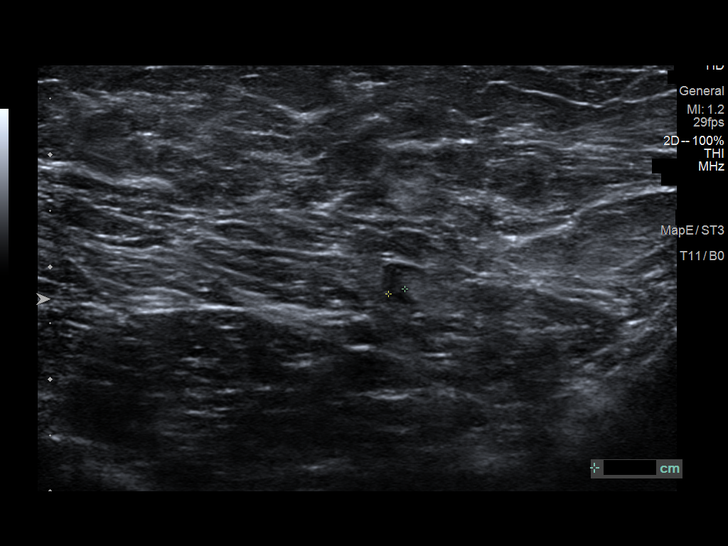

[10 of 10 positions shown; findings below may reference images not displayed]

ACR Breast Density Category c: The breast tissue is heterogeneously
dense, which may obscure small masses.
FINDINGS: 2D/3D full field and spot compression views of the RIGHT breast
demonstrate no persistent suspicious abnormality at the site of the
OUTER RIGHT breast screening study asymmetry, but heterogeneously
dense breast tissue in this area slightly decreases sensitivity.

Targeted ultrasound is performed, showing no sonographic abnormality
within the RIGHT breast in the area of the screening study
asymmetry.

No abnormal RIGHT axillary lymph nodes are identified.
IMPRESSION: 1. No persistent mammographic or sonographic abnormality at the site
of the RIGHT breast screening study finding. No abnormal RIGHT
axillary lymph nodes.

RECOMMENDATION:
Bilateral screening mammogram in 1 year.

I have discussed the findings and recommendations with the patient.
If applicable, a reminder letter will be sent to the patient
regarding the next appointment.

BI-RADS CATEGORY  1: Negative.

## 2023-05-21 ENCOUNTER — Encounter: Payer: Self-pay | Admitting: Allergy & Immunology

## 2023-05-21 ENCOUNTER — Ambulatory Visit: Payer: BC Managed Care – PPO | Admitting: Allergy & Immunology

## 2023-05-21 ENCOUNTER — Other Ambulatory Visit: Payer: Self-pay

## 2023-05-21 ENCOUNTER — Ambulatory Visit (INDEPENDENT_AMBULATORY_CARE_PROVIDER_SITE_OTHER): Payer: Self-pay

## 2023-05-21 VITALS — BP 130/88 | HR 89 | Temp 98.2°F | Resp 18

## 2023-05-21 DIAGNOSIS — J3089 Other allergic rhinitis: Secondary | ICD-10-CM | POA: Diagnosis not present

## 2023-05-21 DIAGNOSIS — J302 Other seasonal allergic rhinitis: Secondary | ICD-10-CM | POA: Diagnosis not present

## 2023-05-21 DIAGNOSIS — T7800XD Anaphylactic reaction due to unspecified food, subsequent encounter: Secondary | ICD-10-CM

## 2023-05-21 DIAGNOSIS — J4540 Moderate persistent asthma, uncomplicated: Secondary | ICD-10-CM

## 2023-05-21 DIAGNOSIS — J309 Allergic rhinitis, unspecified: Secondary | ICD-10-CM | POA: Diagnosis not present

## 2023-05-21 NOTE — Progress Notes (Signed)
 FOLLOW UP  Date of Service/Encounter:  05/26/23   Assessment:   Mild persistent asthma, uncomplicated    Perennial and seasonal allergic rhinitis - on allergen immunotherapy with maintenance reached March 2022   Food allergies (shellfish)  Plan/Recommendations:   1. Moderate persistent asthma without complication - Lung testing looked great today. - We can try stopping the Flovent at the next visit.   - Daily controller medication(s): Flovent 1 puffs twice daily with spacer - Prior to physical activity: albuterol 2 puffs 10-15 minutes before physical activity. - Rescue medications: albuterol 4 puffs every 4-6 hours as needed - Asthma control goals:  * Full participation in all desired activities (may need albuterol before activity) * Albuterol use two time or less a week on average (not counting use with activity) * Cough interfering with sleep two time or less a month * Oral steroids no more than once a year * No hospitalizations  2. Seasonal and perennial allergic rhinitis - Continue with allergy shots at the same schedule. - Continue with Singulair 10mg  at night. - Continue with Dymista TWICE DAILY AS NEEDED (NOTE NEW DOSE).  - Continue with shots for a total of 5 years (March 2027).   3. Anaphylactic shock due to food (shellfish)  - EpiPen is up to date. - Continue with avoidance of shellfish.  4. Return in about 6 months (around 11/20/2023). You can have the follow up appointment with Dr. Dellis Anes or a Nurse Practicioner (our Nurse Practitioners are excellent and always have Physician oversight!).   Subjective:   Samantha Monroe is a 48 y.o. female presenting today for follow up of  Chief Complaint  Patient presents with   Follow-up    She is doing well.    Samantha Monroe has a history of the following: Patient Active Problem List   Diagnosis Date Noted   Extrinsic asthma 12/21/2020   Seasonal and perennial allergic rhinitis 12/21/2020   Allergic  conjunctivitis of both eyes 12/21/2020   Anaphylactic shock due to adverse food reaction 12/21/2020   Abdominal pain, epigastric 07/23/2017   Bloating 07/23/2017   Gastroesophageal reflux disease 03/20/2017   Diastasis recti 03/20/2017   Easy bruising 10/26/2015   Depression with anxiety 10/26/2015   Obesity 10/12/2015   Dyslipidemia (high LDL; low HDL) 10/12/2015   Essential hypertension 10/12/2015    History obtained from: chart review and patient.  Discussed the use of AI scribe software for clinical note transcription with the patient and/or guardian, who gave verbal consent to proceed.  Samantha Monroe is a 48 y.o. female presenting for a follow up visit. She was last seen in October 2024 by Samantha Monroe, one of the nurse practitioners.  At that time, she was restarted on Pulmicort 90 mcg 1 puff twice daily as well as albuterol as needed.  For her allergic rhinitis, she was continued with allergy shots as well as Singulair, Dymista, and her Benadryl was substituted with Zyrtec.  She continue to avoid shellfish.  EpiPen was up-to-date.  Since last visit, she has done very well.  Asthma/Respiratory Symptom History: Her asthma is well-controlled with Flovent, administered as one puff twice daily. She uses an albuterol inhaler less than once a week, with no significant nighttime coughing, although she occasionally wakes up and needs to use it.  Allergic Rhinitis Symptom History: She has been receiving allergy shots since March 2022 and has reached the maintenance phase. The shots have been very effective in managing her symptoms, and she plans to  continue them for two more years. She uses Dymista nasal spray daily throughout the year and has not attempted to discontinue it. She is also on Singulair and has not experienced any recent sinus infections.  [place] is on allergen immunotherapy. She receives two injections. Immunotherapy script #1 contains molds, dust mites, and cockroach. She currently  receives 0.31mL of the RED vial (1/100). Immunotherapy script #2 contains  ragweed, weeds, grasses, cat, and dog. She currently receives 0.47mL of the RED vial (1/100). She started shots November of 2021 and reached maintenance in March of 2022.  Food Allergy Symptom History: She has a history of shellfish allergy but reports no recent exposures or reactions, even after kissing her fianc who had consumed shellfish. She does not wish to be retested at this time.  She started [facility] two weeks ago for weight loss but has not yet noticed any weight loss. She is on the lowest dose and has not experienced any gastrointestinal side effects.  She works for [employer] [place] full time and [employer].   Otherwise, there have been no changes to her past medical history, surgical history, family history, or social history.    Review of systems otherwise negative other than that mentioned in the HPI.    Objective:   Blood pressure 130/88, pulse 89, temperature 98.2 F (36.8 C), temperature source Temporal, resp. rate 18, SpO2 96%. There is no height or weight on file to calculate BMI.    Physical Exam Vitals reviewed.  Constitutional:      Appearance: She is well-developed.     Comments: Delightful and talkative.  HENT:     Head: Normocephalic and atraumatic.     Right Ear: Tympanic membrane, ear canal and external ear normal.     Left Ear: Tympanic membrane, ear canal and external ear normal.     Nose: No nasal deformity, septal deviation, mucosal edema or rhinorrhea.     Right Turbinates: Enlarged, swollen and pale.     Left Turbinates: Enlarged, swollen and pale.     Right Sinus: No maxillary sinus tenderness or frontal sinus tenderness.     Left Sinus: No maxillary sinus tenderness or frontal sinus tenderness.     Comments: No polyps.    Mouth/Throat:     Mouth: Mucous membranes are not pale and not dry.     Pharynx: Uvula midline.  Eyes:     General: Lids are normal. No allergic  shiner.       Right eye: No discharge.        Left eye: No discharge.     Conjunctiva/sclera: Conjunctivae normal.     Right eye: Right conjunctiva is not injected. No chemosis or exudate.    Left eye: Left conjunctiva is not injected. No chemosis or exudate.    Pupils: Pupils are equal, round, and reactive to light.  Cardiovascular:     Rate and Rhythm: Normal rate and regular rhythm.     Heart sounds: Normal heart sounds.  Pulmonary:     Effort: Pulmonary effort is normal. No tachypnea, accessory muscle usage or respiratory distress.     Breath sounds: Normal breath sounds. No wheezing, rhonchi or rales.     Comments: Moving air well in all lung fields.  No increased work of breathing. Chest:     Chest wall: No tenderness.  Lymphadenopathy:     Cervical: No cervical adenopathy.  Skin:    Coloration: Skin is not pale.     Findings: No abrasion, erythema,  petechiae or rash. Rash is not papular, urticarial or vesicular.  Neurological:     Mental Status: She is alert.  Psychiatric:        Behavior: Behavior is cooperative.       Diagnostic studies:    Spirometry: results normal (FEV1: 2.54/113%, FVC: 3.31/120%, FEV1/FVC: 77%).    Spirometry consistent with normal pattern.    Allergy Studies: none       Malachi Bonds, MD  [facility]

## 2023-05-21 NOTE — Progress Notes (Signed)
 Error

## 2023-05-21 NOTE — Patient Instructions (Addendum)
 1. Moderate persistent asthma without complication - Lung testing looked great today. - We can try stopping the Flovent at the next visit.   - Daily controller medication(s): Flovent 1 puffs twice daily with spacer - Prior to physical activity: albuterol 2 puffs 10-15 minutes before physical activity. - Rescue medications: albuterol 4 puffs every 4-6 hours as needed - Asthma control goals:  * Full participation in all desired activities (may need albuterol before activity) * Albuterol use two time or less a week on average (not counting use with activity) * Cough interfering with sleep two time or less a month * Oral steroids no more than once a year * No hospitalizations  2. Seasonal and perennial allergic rhinitis - Continue with allergy shots at the same schedule. - Continue with Singulair 10mg  at night. - Continue with Dymista TWICE DAILY AS NEEDED (NOTE NEW DOSE).  - Continue with shots for a total of 5 years (March 2027).   3. Anaphylactic shock due to food (shellfish)  - EpiPen is up to date. - Continue with avoidance of shellfish.  4. Return in about 6 months (around 11/20/2023). You can have the follow up appointment with Dr. Dellis Anes or a Nurse Practicioner (our Nurse Practitioners are excellent and always have Physician oversight!).    Please inform us of any Emergency Department visits, hospitalizations, or changes in symptoms. Call us before going to the ED for breathing or allergy symptoms since we might be able to fit you in for a sick visit. Feel free to contact us anytime with any questions, problems, or concerns.  It was a pleasure to see you again today!  Websites that have reliable patient information: 1. American Academy of Asthma, Allergy, and Immunology: www.aaaai.org 2. Food Allergy Research and Education (FARE): foodallergy.org 3. Mothers of Asthmatics: http://www.asthmacommunitynetwork.org 4. American College of Allergy, Asthma, and Immunology:  www.acaai.org      "Like" [place] on Facebook and Instagram for our latest updates!      A healthy democracy works best when [employer] participate! Make sure you are registered to vote! If you have moved or changed any of your contact information, you will need to get this updated before voting! Scan the QR codes below to learn more!

## 2023-05-26 ENCOUNTER — Encounter: Payer: Self-pay | Admitting: Allergy & Immunology

## 2023-06-11 DIAGNOSIS — Z1231 Encounter for screening mammogram for malignant neoplasm of breast: Secondary | ICD-10-CM | POA: Diagnosis not present

## 2023-06-11 DIAGNOSIS — Z6830 Body mass index (BMI) 30.0-30.9, adult: Secondary | ICD-10-CM | POA: Diagnosis not present

## 2023-06-11 DIAGNOSIS — Z1272 Encounter for screening for malignant neoplasm of vagina: Secondary | ICD-10-CM | POA: Diagnosis not present

## 2023-06-11 DIAGNOSIS — Z01419 Encounter for gynecological examination (general) (routine) without abnormal findings: Secondary | ICD-10-CM | POA: Diagnosis not present

## 2023-06-17 ENCOUNTER — Ambulatory Visit (INDEPENDENT_AMBULATORY_CARE_PROVIDER_SITE_OTHER)

## 2023-06-17 DIAGNOSIS — J309 Allergic rhinitis, unspecified: Secondary | ICD-10-CM | POA: Diagnosis not present

## 2023-06-23 DIAGNOSIS — J3081 Allergic rhinitis due to animal (cat) (dog) hair and dander: Secondary | ICD-10-CM | POA: Diagnosis not present

## 2023-06-23 NOTE — Progress Notes (Signed)
VIALS MADE 06/23/23

## 2023-06-24 DIAGNOSIS — J3089 Other allergic rhinitis: Secondary | ICD-10-CM | POA: Diagnosis not present

## 2023-06-29 ENCOUNTER — Other Ambulatory Visit: Payer: Self-pay | Admitting: Family

## 2023-07-27 ENCOUNTER — Ambulatory Visit (INDEPENDENT_AMBULATORY_CARE_PROVIDER_SITE_OTHER): Payer: Self-pay

## 2023-07-27 DIAGNOSIS — J309 Allergic rhinitis, unspecified: Secondary | ICD-10-CM

## 2023-08-03 ENCOUNTER — Ambulatory Visit (INDEPENDENT_AMBULATORY_CARE_PROVIDER_SITE_OTHER): Payer: Self-pay

## 2023-08-03 DIAGNOSIS — J309 Allergic rhinitis, unspecified: Secondary | ICD-10-CM | POA: Diagnosis not present

## 2023-08-12 ENCOUNTER — Other Ambulatory Visit: Payer: Self-pay | Admitting: Family Medicine

## 2023-08-12 DIAGNOSIS — I10 Essential (primary) hypertension: Secondary | ICD-10-CM

## 2023-08-12 MED ORDER — ROSUVASTATIN CALCIUM 20 MG PO TABS: 20.0000 mg | ORAL_TABLET | Freq: Every day | ORAL | 0 refills | Status: DC

## 2023-08-12 MED ORDER — AMLODIPINE BESYLATE 5 MG PO TABS
5.0000 mg | ORAL_TABLET | Freq: Every day | ORAL | 0 refills | Status: DC
Start: 2023-08-12 — End: 2023-08-18

## 2023-08-12 NOTE — Addendum Note (Signed)
 Addended by: Georganne Siple M on: 08/12/2023 07:18 AM   Modules accepted: Orders

## 2023-08-14 ENCOUNTER — Other Ambulatory Visit: Payer: Self-pay | Admitting: Family

## 2023-08-17 ENCOUNTER — Ambulatory Visit (INDEPENDENT_AMBULATORY_CARE_PROVIDER_SITE_OTHER)

## 2023-08-17 DIAGNOSIS — J309 Allergic rhinitis, unspecified: Secondary | ICD-10-CM | POA: Diagnosis not present

## 2023-08-18 ENCOUNTER — Encounter: Payer: Self-pay | Admitting: Family Medicine

## 2023-08-18 ENCOUNTER — Ambulatory Visit: Admitting: Family Medicine

## 2023-08-18 VITALS — BP 134/84 | HR 76 | Temp 98.0°F | Resp 16 | Ht 62.0 in | Wt 158.2 lb

## 2023-08-18 DIAGNOSIS — E785 Hyperlipidemia, unspecified: Secondary | ICD-10-CM

## 2023-08-18 DIAGNOSIS — I10 Essential (primary) hypertension: Secondary | ICD-10-CM | POA: Diagnosis not present

## 2023-08-18 DIAGNOSIS — Z1159 Encounter for screening for other viral diseases: Secondary | ICD-10-CM | POA: Diagnosis not present

## 2023-08-18 MED ORDER — AMLODIPINE BESYLATE 5 MG PO TABS: 5.0000 mg | ORAL_TABLET | Freq: Every day | ORAL | 1 refills | Status: AC

## 2023-08-18 MED ORDER — ROSUVASTATIN CALCIUM 20 MG PO TABS: 20.0000 mg | ORAL_TABLET | Freq: Every day | ORAL | 3 refills | Status: AC

## 2023-08-18 MED ORDER — TRIAMTERENE-HCTZ 75-50 MG PO TABS: 1.0000 | ORAL_TABLET | Freq: Every day | ORAL | 1 refills | Status: DC

## 2023-08-18 NOTE — Progress Notes (Signed)
 Chief Complaint  Patient presents with   Medication Refill    Medication Refill     Subjective Samantha Monroe is a 48 y.o. female who presents for hypertension follow up. She does not monitor home blood pressures. She is compliant with medications- Norvasc  5 mg/d, Maxzide hct 75-50 mg/d. Patient has these side effects of medication: none She is adhering to a healthy diet overall. Current exercise: some walking No CP or SOB.   Hyperlipidemia Patient presents for hyperlipidemia follow up. Currently being treated with Crestor  20 mg/d and compliance with treatment thus far has been good. She denies myalgias. Diet/exercise as above. The patient is not known to have coexisting coronary artery disease.   Past Medical History:  Diagnosis Date   Allergy     Anemia    Anxiety    Asthma    Depression    Dyslipidemia (high LDL; low HDL) 10/12/2015   Easy bruising 10/26/2015   Essential hypertension 10/12/2015   GERD (gastroesophageal reflux disease)    Heart murmur    High cholesterol    High triglycerides    Hypertension    Migraines    Obesity 10/12/2015   Seizures (HCC)     Exam BP 134/84 (BP Location: Left Arm, Patient Position: Sitting)   Pulse 76   Temp 98 F (36.7 C) (Oral)   Resp 16   Ht 5' 2 (1.575 m)   Wt 158 lb 3.2 oz (71.8 kg)   SpO2 98%   BMI 28.94 kg/m  General:  well developed, well nourished, in no apparent distress Heart: RRR, no bruits, no LE edema Lungs: clear to auscultation, no accessory muscle use Psych: well oriented with normal range of affect and appropriate judgment/insight  Essential hypertension - Plan: amLODipine  (NORVASC ) 5 MG tablet, Comprehensive metabolic panel with GFR, Lipid panel  Dyslipidemia (high LDL; low HDL)  Need for hepatitis B screening test - Plan: Hepatitis B surface antibody,qualitative  Chronic, stable.  Continue Maxide 75 to 50 mg daily, Norvasc  5 mg daily.  She is on Wegovy and losing weight so she will monitor her  blood pressure at home.  Counseled on diet and exercise. Chronic, stable.  Continue Crestor  20 mg daily. F/u in 6 mo. The patient voiced understanding and agreement to the plan.  [name], DO 08/18/23  4:05 PM

## 2023-08-18 NOTE — Patient Instructions (Signed)
Keep the diet clean and stay active.  Give us 2-3 business days to get the results of your labs back.   Let us know if you need anything.  

## 2023-08-19 ENCOUNTER — Ambulatory Visit: Payer: Self-pay | Admitting: Family Medicine

## 2023-08-19 LAB — COMPREHENSIVE METABOLIC PANEL WITH GFR
ALT: 11 U/L (ref 0–35)
AST: 14 U/L (ref 0–37)
Albumin: 4.7 g/dL (ref 3.5–5.2)
Alkaline Phosphatase: 63 U/L (ref 39–117)
BUN: 13 mg/dL (ref 6–23)
CO2: 29 meq/L (ref 19–32)
Calcium: 9.9 mg/dL (ref 8.4–10.5)
Chloride: 103 meq/L (ref 96–112)
Creatinine, Ser: 0.64 mg/dL (ref 0.40–1.20)
GFR: 103.49 mL/min (ref >60.00)
Glucose, Bld: 78 mg/dL (ref 70–99)
Potassium: 4.2 meq/L (ref 3.5–5.1)
Sodium: 138 meq/L (ref 135–145)
Total Bilirubin: 0.4 mg/dL (ref 0.2–1.2)
Total Protein: 7.4 g/dL (ref 6.0–8.3)

## 2023-08-19 LAB — LIPID PANEL
Cholesterol: 172 mg/dL (ref 0–200)
HDL: 56.5 mg/dL (ref >39.00)
LDL Cholesterol: 100 mg/dL — ABNORMAL HIGH (ref 0–99)
NonHDL: 115.77
Total CHOL/HDL Ratio: 3
Triglycerides: 80 mg/dL (ref 0.0–149.0)
VLDL: 16 mg/dL (ref 0.0–40.0)

## 2023-08-19 LAB — HEPATITIS B SURFACE ANTIBODY,QUALITATIVE: Hep B S Ab: NONREACTIVE

## 2023-09-04 ENCOUNTER — Ambulatory Visit

## 2023-09-04 DIAGNOSIS — J309 Allergic rhinitis, unspecified: Secondary | ICD-10-CM

## 2023-09-08 ENCOUNTER — Ambulatory Visit (INDEPENDENT_AMBULATORY_CARE_PROVIDER_SITE_OTHER)

## 2023-09-08 DIAGNOSIS — J309 Allergic rhinitis, unspecified: Secondary | ICD-10-CM

## 2023-09-14 DIAGNOSIS — Z6827 Body mass index (BMI) 27.0-27.9, adult: Secondary | ICD-10-CM | POA: Diagnosis not present

## 2023-09-14 DIAGNOSIS — Z713 Dietary counseling and surveillance: Secondary | ICD-10-CM | POA: Diagnosis not present

## 2023-10-08 ENCOUNTER — Other Ambulatory Visit: Payer: Self-pay | Admitting: Family Medicine

## 2023-10-22 ENCOUNTER — Ambulatory Visit (INDEPENDENT_AMBULATORY_CARE_PROVIDER_SITE_OTHER)

## 2023-10-22 DIAGNOSIS — J309 Allergic rhinitis, unspecified: Secondary | ICD-10-CM | POA: Diagnosis not present

## 2023-11-05 DIAGNOSIS — Z713 Dietary counseling and surveillance: Secondary | ICD-10-CM | POA: Diagnosis not present

## 2023-11-05 DIAGNOSIS — Z6826 Body mass index (BMI) 26.0-26.9, adult: Secondary | ICD-10-CM | POA: Diagnosis not present

## 2023-11-16 NOTE — Patient Instructions (Incomplete)
 1. Moderate persistent asthma without complication-controlled Stop Flovent  110 mcg 1 puff twice a day, but if symptoms worsen restart Flovent  110 mcg 1 puff twice a day with spacer - Daily controller medication(s): none - Prior to physical activity: albuterol  2 puffs 10-15 minutes before physical activity. -During upper respiratory infections/asthma flare start Flovent  110 mcg taking 2 puffs twice a day for 1 to 2 weeks or until symptoms return to baseline - Rescue medications: albuterol  4 puffs every 4-6 hours as needed - Asthma control goals:  * Full participation in all desired activities (may need albuterol  before activity) * Albuterol  use two time or less a week on average (not counting use with activity) * Cough interfering with sleep two time or less a month * Oral steroids no more than once a year * No hospitalizations  2. Seasonal and perennial allergic rhinitis-moderately controlled - Continue with allergy  shots at the same schedule. - Continue with Singulair  10mg  at night. - Continue with Dymista  TWICE DAILY AS NEEDED (NOTE NEW DOSE).  Try using this more frequently right now while you are having the drainage.  Let us  know if it does not get better - Continue with shots for a total of 5 years (March 2027).   3. Anaphylactic shock due to food (shellfish)  - EpiPen  refill sent - Continue with avoidance of shellfish.  4. Follow up in 3-4 months or sooner if needed    Please inform us  of any Emergency Department visits, hospitalizations, or changes in symptoms. Call us  before going to the ED for breathing or allergy  symptoms since we might be able to fit you in for a sick visit. Feel free to contact us  anytime with any questions, problems, or concerns.   Websites that have reliable patient information: 1. American Academy of Asthma, Allergy , and Immunology: www.aaaai.org 2. Food Allergy  Research and Education (FARE): foodallergy.org 3. Mothers of Asthmatics:  http://www.asthmacommunitynetwork.org 4. [employer] of Allergy , Asthma, and Immunology: www.acaai.org

## 2023-11-17 ENCOUNTER — Encounter: Payer: Self-pay | Admitting: Family

## 2023-11-17 ENCOUNTER — Ambulatory Visit: Admitting: Family

## 2023-11-17 ENCOUNTER — Other Ambulatory Visit: Payer: Self-pay

## 2023-11-17 VITALS — BP 130/100 | HR 77 | Temp 98.5°F | Ht 62.0 in | Wt 145.0 lb

## 2023-11-17 DIAGNOSIS — J302 Other seasonal allergic rhinitis: Secondary | ICD-10-CM

## 2023-11-17 DIAGNOSIS — J4540 Moderate persistent asthma, uncomplicated: Secondary | ICD-10-CM | POA: Diagnosis not present

## 2023-11-17 DIAGNOSIS — T7800XD Anaphylactic reaction due to unspecified food, subsequent encounter: Secondary | ICD-10-CM | POA: Diagnosis not present

## 2023-11-17 DIAGNOSIS — J3089 Other allergic rhinitis: Secondary | ICD-10-CM

## 2023-11-17 MED ORDER — EPINEPHRINE 0.3 MG/0.3ML IJ SOAJ: INTRAMUSCULAR | 1 refills | Status: AC

## 2023-11-17 NOTE — Progress Notes (Signed)
 [address] [place] [address] Dept: [phone]  FOLLOW UP NOTE  Patient ID: Samantha Monroe, female    DOB: 08-03-1975  Age: 48 y.o. MRN: [id number] Date of Office Visit: 11/17/2023  Assessment  Chief Complaint: Follow-up, Cough (Dry cough), and Sinus Problem (Drainage down throat)  HPI Samantha Monroe is a 48 year old female who presents today for follow-up of moderate persistent asthma without complication, seasonal and perennial allergic rhinitis, and anaphylactic shock due to food.  She was last seen on May 26, 2023 by Dr. Iva.  She denies any new diagnosis or surgery since her last office visit.  Moderate persistent asthma: She reports that she has not been using her Flovent  1 puff twice a day as recommended.  She maybe is doing it every few days.  She reports dry cough, but feels like it is due to her postnasal drip and denies wheezing, tightness in chest, shortness of breath, and nocturnal awakenings due to breathing problems.  Since her last office visit she has not required any systemic steroids or made any trips to the emergency room or urgent care due to breathing problems.  She hardly ever uses her albuterol  inhaler.  Seasonal and perennial allergic rhinitis: She reports postnasal drip that she has noticed with weather changes and it has maybe been a week or so.  The color of her postnasal drip is clear.  She feels like she probably needs to start back using her Dymista  nasal spray.  When she uses this nasal spray it helps with the drainage.  She has just been using the Dymista  nasal spray as needed.  She does take Singulair  10 mg at night and receives allergy  injections per protocol.  She denies any problems or reactions with her allergy  injections.  Her allergy  injections do help.  She denies any fever, chills, sinus pressure/sinus tenderness, rhinorrhea, and nasal congestion.  She has not been treated for any sinus infections since we last saw her.  Anaphylactic  shock due to food: She continues to avoid shellfish without any accidental ingestion or use of her epinephrine  autoinjector device.   Drug Allergies:  Allergies  Allergen Reactions   Aspirin Other (See Comments)    Eye redness, felt like throat was closing.   Motrin  [Ibuprofen ]     Felt like throat was closing   Shellfish Allergy  Other (See Comments)    Felt like throat was closing    Review of Systems: Negative except as per HPI   Physical Exam: BP (!) 130/100   Pulse 77   Temp 98.5 F (36.9 C)   Ht 5' 2 (1.575 m)   Wt 145 lb (65.8 kg)   SpO2 96%   BMI 26.52 kg/m    Physical Exam Constitutional:      Appearance: Normal appearance.  HENT:     Head: Normocephalic and atraumatic.     Right Ear: Tympanic membrane, ear canal and external ear normal.     Left Ear: Tympanic membrane, ear canal and external ear normal.     Nose: Nose normal.     Mouth/Throat:     Mouth: Mucous membranes are moist.     Pharynx: Oropharynx is clear.  Eyes:     Conjunctiva/sclera: Conjunctivae normal.  Cardiovascular:     Rate and Rhythm: Regular rhythm.     Heart sounds: Normal heart sounds.  Pulmonary:     Effort: Pulmonary effort is normal.     Breath sounds: Normal breath sounds.  Comments: Lungs clear to auscultation Musculoskeletal:     Cervical back: Neck supple.  Skin:    General: Skin is warm.  Neurological:     Mental Status: She is alert and oriented to person, place, and time.  Psychiatric:        Mood and Affect: Mood normal.        Behavior: Behavior normal.        Thought Content: Thought content normal.        Judgment: Judgment normal.     Diagnostics: FVC 3.40 L (123%), FEV1 2.66 L (119%), FEV1/FVC 0.78.  Spirometry indicates normal spirometry.  Assessment and Plan: 1. Seasonal and perennial allergic rhinitis   2. Moderate persistent asthma without complication   3. Anaphylactic shock due to food, subsequent encounter     Meds ordered this encounter   Medications   EPINEPHrine  (AUVI-Q ) 0.3 mg/0.3 mL IJ SOAJ injection    Sig: Use as directed for severe allergic reaction    Dispense:  2 each    Refill:  1    [id number]    Patient Instructions  1. Moderate persistent asthma without complication-controlled Stop Flovent  110 mcg 1 puff twice a day, but if symptoms worsen restart Flovent  110 mcg 1 puff twice a day with spacer - Daily controller medication(s): none - Prior to physical activity: albuterol  2 puffs 10-15 minutes before physical activity. -During upper respiratory infections/asthma flare start Flovent  110 mcg taking 2 puffs twice a day for 1 to 2 weeks or until symptoms return to baseline - Rescue medications: albuterol  4 puffs every 4-6 hours as needed - Asthma control goals:  * Full participation in all desired activities (may need albuterol  before activity) * Albuterol  use two time or less a week on average (not counting use with activity) * Cough interfering with sleep two time or less a month * Oral steroids no more than once a year * No hospitalizations  2. Seasonal and perennial allergic rhinitis-moderately controlled - Continue with allergy  shots at the same schedule. - Continue with Singulair  10mg  at night. - Continue with Dymista  TWICE DAILY AS NEEDED (NOTE NEW DOSE).  Try using this more frequently right now while you are having the drainage.  Let us  know if it does not get better - Continue with shots for a total of 5 years (March 2027).   3. Anaphylactic shock due to food (shellfish)  - EpiPen  refill sent - Continue with avoidance of shellfish.  4. Follow up in 3-4 months or sooner if needed    Please inform us  of any Emergency Department visits, hospitalizations, or changes in symptoms. Call us  before going to the ED for breathing or allergy  symptoms since we might be able to fit you in for a sick visit. Feel free to contact us  anytime with any questions, problems, or concerns.   Websites that have  reliable patient information: 1. American Academy of Asthma, Allergy , and Immunology: www.aaaai.org 2. Food Allergy  Research and Education (FARE): foodallergy.org 3. Mothers of Asthmatics: http://www.asthmacommunitynetwork.org 4. American College of Allergy , Asthma, and Immunology: www.acaai.org              Return in about 3 months (around 02/16/2024).    Thank you for the opportunity to care for this patient.  Please do not hesitate to contact me with questions.  Wanda Craze, FNP [facility] 

## 2023-11-17 NOTE — Addendum Note (Signed)
 Addended by: NANCEE JON SAILOR on: 11/17/2023 06:37 PM   Modules accepted: Orders

## 2023-12-06 ENCOUNTER — Other Ambulatory Visit: Payer: Self-pay | Admitting: Family

## 2023-12-09 ENCOUNTER — Other Ambulatory Visit: Payer: Self-pay | Admitting: Family

## 2023-12-17 ENCOUNTER — Ambulatory Visit (INDEPENDENT_AMBULATORY_CARE_PROVIDER_SITE_OTHER)

## 2023-12-17 DIAGNOSIS — J302 Other seasonal allergic rhinitis: Secondary | ICD-10-CM

## 2023-12-17 DIAGNOSIS — J3089 Other allergic rhinitis: Secondary | ICD-10-CM

## 2024-01-06 DIAGNOSIS — Z6826 Body mass index (BMI) 26.0-26.9, adult: Secondary | ICD-10-CM | POA: Diagnosis not present

## 2024-01-06 DIAGNOSIS — Z713 Dietary counseling and surveillance: Secondary | ICD-10-CM | POA: Diagnosis not present

## 2024-01-27 ENCOUNTER — Other Ambulatory Visit: Payer: Self-pay | Admitting: Family

## 2024-02-18 ENCOUNTER — Other Ambulatory Visit: Payer: Self-pay | Admitting: Family Medicine

## 2024-02-24 ENCOUNTER — Other Ambulatory Visit: Payer: Self-pay

## 2024-02-24 ENCOUNTER — Ambulatory Visit (INDEPENDENT_AMBULATORY_CARE_PROVIDER_SITE_OTHER): Admitting: Family Medicine

## 2024-02-24 ENCOUNTER — Ambulatory Visit: Payer: Self-pay | Admitting: Family Medicine

## 2024-02-24 ENCOUNTER — Encounter: Payer: Self-pay | Admitting: Family Medicine

## 2024-02-24 VITALS — BP 126/76 | HR 84 | Temp 98.0°F | Resp 16 | Ht 62.0 in | Wt 147.2 lb

## 2024-02-24 DIAGNOSIS — Z23 Encounter for immunization: Secondary | ICD-10-CM | POA: Diagnosis not present

## 2024-02-24 DIAGNOSIS — E876 Hypokalemia: Secondary | ICD-10-CM

## 2024-02-24 DIAGNOSIS — E781 Pure hyperglyceridemia: Secondary | ICD-10-CM

## 2024-02-24 DIAGNOSIS — Z0000 Encounter for general adult medical examination without abnormal findings: Secondary | ICD-10-CM

## 2024-02-24 LAB — CBC
HCT: 42.3 % (ref 36.0–46.0)
Hemoglobin: 14.2 g/dL (ref 12.0–15.0)
MCHC: 33.5 g/dL (ref 30.0–36.0)
MCV: 86.3 fl (ref 78.0–100.0)
Platelets: 251 K/uL (ref 150.0–400.0)
RBC: 4.91 Mil/uL (ref 3.87–5.11)
RDW: 13.3 % (ref 11.5–15.5)
WBC: 6.2 K/uL (ref 4.0–10.5)

## 2024-02-24 LAB — COMPREHENSIVE METABOLIC PANEL WITH GFR
ALT: 10 U/L (ref 3–35)
AST: 16 U/L (ref 5–37)
Albumin: 4.6 g/dL (ref 3.5–5.2)
Alkaline Phosphatase: 57 U/L (ref 39–117)
BUN: 19 mg/dL (ref 6–23)
CO2: 27 meq/L (ref 19–32)
Calcium: 10.2 mg/dL (ref 8.4–10.5)
Chloride: 100 meq/L (ref 96–112)
Creatinine, Ser: 0.77 mg/dL (ref 0.40–1.20)
GFR: 90.01 mL/min
Glucose, Bld: 89 mg/dL (ref 70–99)
Potassium: 3.4 meq/L — ABNORMAL LOW (ref 3.5–5.1)
Sodium: 135 meq/L (ref 135–145)
Total Bilirubin: 0.5 mg/dL (ref 0.2–1.2)
Total Protein: 7.4 g/dL (ref 6.0–8.3)

## 2024-02-24 LAB — LIPID PANEL
Cholesterol: 186 mg/dL (ref 28–200)
HDL: 60 mg/dL
LDL Cholesterol: 92 mg/dL (ref 10–99)
NonHDL: 125.87
Total CHOL/HDL Ratio: 3
Triglycerides: 170 mg/dL — ABNORMAL HIGH (ref 10.0–149.0)
VLDL: 34 mg/dL (ref 0.0–40.0)

## 2024-02-24 NOTE — Addendum Note (Signed)
 Addended by: Jerusalem Brownstein M on: 02/24/2024 08:02 AM   Modules accepted: Orders

## 2024-02-24 NOTE — Addendum Note (Signed)
 Addended by: Camerin Jimenez M on: 02/24/2024 04:59 PM   Modules accepted: Orders

## 2024-02-24 NOTE — Progress Notes (Signed)
 Chief Complaint  Patient presents with   Annual Exam    CPE     [name] is here for a complete physical.   Her last physical was >1 year ago.  Current diet: in general, a healthy diet. Current exercise: walking. Weight is stable and she denies fatigue out of ordinary. Seatbelt? Yes Advanced directive? No  Health Maintenance Pap/HPV- Yes Mammogram- Yes Colon cancer screening-Yes Shingrix- No Tetanus- Yes Hep C screening- Yes HIV screening- Yes  Past Medical History:  Diagnosis Date   Allergy     Anemia    Anxiety    Asthma    Depression    Dyslipidemia (high LDL; low HDL) 10/12/2015   Easy bruising 10/26/2015   Essential hypertension 10/12/2015   GERD (gastroesophageal reflux disease)    Heart murmur    High cholesterol    High triglycerides    Hypertension    Migraines    Obesity 10/12/2015   Seizures (HCC)      Past Surgical History:  Procedure Laterality Date   APPENDECTOMY     CESAREAN SECTION     3 C sections   DILITATION & CURRETTAGE/HYSTROSCOPY WITH THERMACHOICE ABLATION  01/09/2012   Procedure: DILATATION & CURETTAGE/HYSTEROSCOPY WITH THERMACHOICE ABLATION;  Surgeon: Rosaline LITTIE Cobble, MD;  Location: [facility];  Service: Gynecology;  Laterality: N/A;   TONSILLECTOMY     TUBAL LIGATION      Medications  Medications Ordered Prior to Encounter[1]   Allergies Allergies[2]  Review of Systems: Constitutional:  no unexpected weight changes Eye:  no recent significant change in vision Ear/Nose/Mouth/Throat:  Ears:  no recent change in hearing Nose/Mouth/Throat:  no complaints of nasal congestion, no sore throat Cardiovascular: no chest pain Respiratory:  no shortness of breath Gastrointestinal:  no abdominal pain, no change in bowel habits GU:  Female: negative for dysuria or pelvic pain Musculoskeletal/Extremities:  no pain of the joints Integumentary (Skin/Breast):  no abnormal skin lesions reported Neurologic:  no  headaches Endocrine:  denies fatigue  Exam BP 126/76 (BP Location: Left Arm, Patient Position: Sitting)   Pulse 84   Temp 98 F (36.7 C) (Oral)   Resp 16   Ht 5' 2 (1.575 m)   Wt 147 lb 3.2 oz (66.8 kg)   SpO2 95%   BMI 26.92 kg/m  General:  well developed, well nourished, in no apparent distress Skin:  no significant moles, warts, or growths Head:  no masses, lesions, or tenderness Eyes:  pupils equal and round, sclera anicteric without injection Ears:  canals without lesions, TMs shiny without retraction, no obvious effusion, no erythema Nose:  nares patent, mucosa normal, and no drainage  Throat/Pharynx:  lips and gingiva without lesion; tongue and uvula midline; non-inflamed pharynx; no exudates or postnasal drainage Neck: neck supple without adenopathy, thyromegaly, or masses Lungs:  clear to auscultation, breath sounds equal bilaterally, no respiratory distress Cardio:  regular rate and rhythm, no LE edema Abdomen:  abdomen soft, nontender; bowel sounds normal; no masses or organomegaly Genital: Defer to GYN Musculoskeletal:  symmetrical muscle groups noted without atrophy or deformity Extremities:  no clubbing, cyanosis, or edema, no deformities, no skin discoloration Neuro:  gait normal; deep tendon reflexes normal and symmetric Psych: well oriented with normal range of affect and appropriate judgment/insight  Assessment and Plan  Well adult exam - Plan: CBC, Comprehensive metabolic panel with GFR, Lipid panel, Hepatitis B surface antibody,quantitative   Well 49 y.o. female. Counseled on diet and exercise. Advanced directive form  provided today.  Hep B screening as above.  PCV20 today.  Shingrix recommended. Flu shot politely declined.  Other orders as above. Follow up in 6 mo. The patient voiced understanding and agreement to the plan.  [name], DO 02/24/2024 7:18 AM     [1]  Current Outpatient Medications on File Prior to Visit  Medication  Sig Dispense Refill   albuterol  (VENTOLIN  HFA) 108 (90 Base) MCG/ACT inhaler INHALE 2 PUFFS EVERY 4 TO 6 HOURS AS NEEDED FOR COUGH/WHEEZE/CHEST TIGHTNESS/SHORTNESS OF BREATH 6.7 each 1   amLODipine  (NORVASC ) 5 MG tablet Take 1 tablet (5 mg total) by mouth daily. 90 tablet 1   Azelastine -Fluticasone  137-50 MCG/ACT SUSP PLACE 2 SPRAYS IN EACH NOSTRIL ONCE A DAY AS NEEDED FOR RUNNY NOSE/STUFFY NOSE 23 g 2   Budesonide  (PULMICORT  FLEXHALER) 90 MCG/ACT inhaler Inhale 1 puff into the lungs 2 (two) times daily. 3 each 1   cetirizine  (ZYRTEC ) 10 MG tablet TAKE 1 TABLET ONCE A DAY AS NEEDED FOR RUNNY NOSE OR ITCHING 90 tablet 1   EPINEPHrine  (AUVI-Q ) 0.3 mg/0.3 mL IJ SOAJ injection Use as directed for severe allergic reaction 2 each 1   escitalopram  (LEXAPRO ) 10 MG tablet Take 10 mg by mouth daily.     Ferrous Sulfate (IRON PO) Take by mouth.     montelukast  (SINGULAIR ) 10 MG tablet TAKE 1 TABLET BY MOUTH EVERYDAY AT BEDTIME 90 tablet 1   Multiple Vitamins-Minerals (WOMENS 50+ MULTI VITAMIN PO) Take by mouth.     norethindrone (AYGESTIN) 5 MG tablet TAKE 1 TABLET BY MOUTH TWICE A DAY FOR 14 DAYS     rosuvastatin  (CRESTOR ) 20 MG tablet Take 1 tablet (20 mg total) by mouth daily. 90 tablet 3   triamterene -hydrochlorothiazide (MAXZIDE) 75-50 MG tablet TAKE 1 TABLET BY MOUTH EVERY DAY 90 tablet 1   WEGOVY 0.25 MG/0.5ML SOAJ Inject 0.25 mg into the skin.     zolpidem (AMBIEN) 10 MG tablet Take 10 mg by mouth at bedtime as needed. For insomina     No current facility-administered medications on file prior to visit.  [2]  Allergies Allergen Reactions   Aspirin Other (See Comments)    Eye redness, felt like throat was closing.   Motrin  [Ibuprofen ]     Felt like throat was closing   Shellfish Allergy  Other (See Comments)    Felt like throat was closing

## 2024-02-24 NOTE — Patient Instructions (Addendum)
 Give us  2-3 business days to get the results of your labs back.   Keep the diet clean and stay active.  Please consider adding some weight resistance exercise to your routine. Consider yoga as well.   Please get me a copy of your advanced directive form at your convenience.   Take Metamucil or Benefiber daily. Use Miralax as needed.   The Shingrix vaccine (for shingles) is a 2 shot series spaced 2-6 months apart. It can make people feel low energy, achy and almost like they have the flu for 48 hours after injection. 1/5 people can have nausea and/or vomiting. Please plan accordingly when deciding on when to get this shot. Call our office for a nurse visit appointment to get this. The second shot of the series is less severe regarding the side effects, but it still lasts 48 hours.   Let us  know if you need anything.

## 2024-02-25 LAB — HEPATITIS B SURFACE ANTIBODY, QUANTITATIVE: Hep B S AB Quant (Post): <5 m[IU]/mL — ABNORMAL LOW

## 2024-03-02 ENCOUNTER — Ambulatory Visit (INDEPENDENT_AMBULATORY_CARE_PROVIDER_SITE_OTHER)

## 2024-03-02 DIAGNOSIS — J302 Other seasonal allergic rhinitis: Secondary | ICD-10-CM

## 2024-03-05 ENCOUNTER — Other Ambulatory Visit: Payer: Self-pay | Admitting: Family

## 2024-03-10 ENCOUNTER — Ambulatory Visit

## 2024-03-10 DIAGNOSIS — J302 Other seasonal allergic rhinitis: Secondary | ICD-10-CM

## 2024-03-11 ENCOUNTER — Other Ambulatory Visit: Payer: Self-pay | Admitting: Family

## 2024-03-11 ENCOUNTER — Other Ambulatory Visit: Payer: Self-pay

## 2024-03-11 DIAGNOSIS — J4540 Moderate persistent asthma, uncomplicated: Secondary | ICD-10-CM

## 2024-03-11 MED ORDER — PULMICORT FLEXHALER 90 MCG/ACT IN AEPB: 1.0000 | INHALATION_SPRAY | Freq: Two times a day (BID) | RESPIRATORY_TRACT | 1 refills | Status: DC

## 2024-03-11 MED ORDER — PULMICORT FLEXHALER 90 MCG/ACT IN AEPB: 1.0000 | INHALATION_SPRAY | Freq: Two times a day (BID) | RESPIRATORY_TRACT | 1 refills | Status: AC

## 2024-03-11 NOTE — Progress Notes (Signed)
 err

## 2024-03-11 NOTE — Addendum Note (Signed)
 Addended by: MARCINE ISAIAH CROME on: 03/11/2024 10:54 AM   Modules accepted: Orders

## 2024-03-11 NOTE — Addendum Note (Signed)
 Addended by: MARCINE ISAIAH CROME on: 03/11/2024 10:53 AM   Modules accepted: Orders

## 2024-03-18 ENCOUNTER — Ambulatory Visit (INDEPENDENT_AMBULATORY_CARE_PROVIDER_SITE_OTHER)

## 2024-03-18 DIAGNOSIS — J302 Other seasonal allergic rhinitis: Secondary | ICD-10-CM

## 2024-08-23 ENCOUNTER — Ambulatory Visit: Admitting: Family Medicine
